# Patient Record
Sex: Female | Born: 1955 | Race: Black or African American | Hispanic: No | Marital: Married | Smoking: Never smoker
Health system: Southern US, Community
[De-identification: ages and names within clinical notes are randomized; demographics above are authoritative.]

## PROBLEM LIST (undated history)

## (undated) DIAGNOSIS — I10 Essential (primary) hypertension: Secondary | ICD-10-CM

## (undated) DIAGNOSIS — K219 Gastro-esophageal reflux disease without esophagitis: Secondary | ICD-10-CM

## (undated) DIAGNOSIS — E785 Hyperlipidemia, unspecified: Secondary | ICD-10-CM

## (undated) DIAGNOSIS — E876 Hypokalemia: Secondary | ICD-10-CM

## (undated) HISTORY — DX: Hyperlipidemia, unspecified: E78.5

## (undated) HISTORY — DX: Gastro-esophageal reflux disease without esophagitis: K21.9

## (undated) HISTORY — DX: Essential (primary) hypertension: I10

## (undated) HISTORY — PX: VAGINAL HYSTERECTOMY: SUR661

## (undated) HISTORY — DX: Hypokalemia: E87.6

---

## 2009-11-06 ENCOUNTER — Ambulatory Visit: Payer: Self-pay | Admitting: Family Medicine

## 2010-11-10 ENCOUNTER — Ambulatory Visit: Payer: Self-pay | Admitting: Family Medicine

## 2011-06-18 ENCOUNTER — Ambulatory Visit: Payer: Self-pay | Admitting: Family Medicine

## 2011-11-16 ENCOUNTER — Ambulatory Visit: Payer: Self-pay | Admitting: Family Medicine

## 2012-11-16 ENCOUNTER — Ambulatory Visit: Payer: Self-pay | Admitting: Family Medicine

## 2012-11-21 ENCOUNTER — Ambulatory Visit: Payer: Self-pay | Admitting: Family Medicine

## 2013-04-25 ENCOUNTER — Ambulatory Visit: Payer: Self-pay | Admitting: Family Medicine

## 2013-09-18 ENCOUNTER — Ambulatory Visit: Payer: Self-pay | Admitting: Family Medicine

## 2014-01-21 ENCOUNTER — Ambulatory Visit: Payer: Self-pay | Admitting: Family Medicine

## 2014-12-17 ENCOUNTER — Ambulatory Visit (INDEPENDENT_AMBULATORY_CARE_PROVIDER_SITE_OTHER): Payer: BC Managed Care – PPO | Admitting: Family Medicine

## 2014-12-17 ENCOUNTER — Encounter: Payer: Self-pay | Admitting: Family Medicine

## 2014-12-17 ENCOUNTER — Other Ambulatory Visit: Payer: Self-pay

## 2014-12-17 ENCOUNTER — Other Ambulatory Visit: Payer: Self-pay | Admitting: Family Medicine

## 2014-12-17 VITALS — BP 130/80 | HR 78 | Ht 61.0 in | Wt 212.0 lb

## 2014-12-17 DIAGNOSIS — I10 Essential (primary) hypertension: Secondary | ICD-10-CM

## 2014-12-17 DIAGNOSIS — R635 Abnormal weight gain: Secondary | ICD-10-CM | POA: Insufficient documentation

## 2014-12-17 DIAGNOSIS — M2550 Pain in unspecified joint: Secondary | ICD-10-CM

## 2014-12-17 DIAGNOSIS — E876 Hypokalemia: Secondary | ICD-10-CM | POA: Insufficient documentation

## 2014-12-17 DIAGNOSIS — R609 Edema, unspecified: Secondary | ICD-10-CM

## 2014-12-17 DIAGNOSIS — K2100 Gastro-esophageal reflux disease with esophagitis, without bleeding: Secondary | ICD-10-CM | POA: Insufficient documentation

## 2014-12-17 DIAGNOSIS — Z0000 Encounter for general adult medical examination without abnormal findings: Secondary | ICD-10-CM | POA: Insufficient documentation

## 2014-12-17 DIAGNOSIS — J309 Allergic rhinitis, unspecified: Secondary | ICD-10-CM | POA: Insufficient documentation

## 2014-12-17 MED ORDER — IBUPROFEN 600 MG PO TABS: 600.0000 mg | ORAL_TABLET | Freq: Three times a day (TID) | ORAL | Status: DC | PRN

## 2014-12-17 NOTE — Progress Notes (Signed)
Name: Toni Arroyo   MRN: [id number]    DOB: Jun 03, 1956   Date:12/17/2014       Progress Note  Subjective  Chief Complaint  Chief Complaint  Patient presents with  . Edema    ankle- especially on the L) side, "down some in the morning"    Hypertension This is a recurrent problem. The current episode started 1 to 4 weeks ago. The problem has been waxing and waning since onset. The problem is uncontrolled. Associated symptoms include peripheral edema. Pertinent negatives include no anxiety, blurred vision, chest pain, headaches, malaise/fatigue, neck pain, orthopnea, palpitations, PND, shortness of breath or sweats. There are no associated agents to hypertension. There are no known risk factors for coronary artery disease. Past treatments include beta blockers and diuretics. The current treatment provides mild improvement. There are no compliance problems.  There is no history of angina, kidney disease, CAD/MI, CVA, heart failure, left ventricular hypertrophy, PVD, renovascular disease, retinopathy or a thyroid problem. There is no history of chronic renal disease, coarctation of the aorta, hyperaldosteronism, hypercortisolism, hyperparathyroidism, a hypertension causing med, pheochromocytoma or sleep apnea.    No problem-specific assessment & plan notes found for this encounter.   Past Medical History  Diagnosis Date  . Hypertension   . Hyperlipidemia   . GERD (gastroesophageal reflux disease)   . Hypokalemia     Past Surgical History  Procedure Laterality Date  . Vaginal hysterectomy    . Cesarean section      x 1    Family History  Problem Relation Age of Onset  . Diabetes Sister     History   Social History  . Marital Status: Married    Spouse Name: N/A  . Number of Children: N/A  . Years of Education: N/A   Occupational History  . Not on file.   Social History Main Topics  . Smoking status: Never Smoker   . Smokeless tobacco: Not on file  . Alcohol Use: No  .  Drug Use: No  . Sexual Activity: Yes   Other Topics Concern  . Not on file   Social History Narrative  . No narrative on file    No Known Allergies   Review of Systems  Constitutional: Negative for fever, chills and malaise/fatigue.  Eyes: Negative for blurred vision.  Respiratory: Negative for cough, sputum production, shortness of breath and wheezing.   Cardiovascular: Positive for leg swelling. Negative for chest pain, palpitations, orthopnea, claudication and PND.  Gastrointestinal: Negative for heartburn, nausea, abdominal pain, diarrhea and constipation.  Genitourinary: Negative for dysuria.  Musculoskeletal: Negative for myalgias and neck pain.  Skin: Negative for rash.  Neurological: Negative for dizziness, focal weakness and headaches.  Endo/Heme/Allergies: Negative for polydipsia.  Psychiatric/Behavioral: Negative for depression and suicidal ideas.     Objective  Filed Vitals:   12/17/14 1403  BP: 130/80  Pulse: 78  Height: 5\' 1"  (1.549 m)  Weight: 212 lb (96.163 kg)    Physical Exam  Constitutional: She is well-developed, well-nourished, and in no distress. No distress.  HENT:  Head: Normocephalic and atraumatic.  Right Ear: External ear normal.  Left Ear: External ear normal.  Nose: Nose normal.  Mouth/Throat: Oropharynx is clear and moist.  Eyes: Conjunctivae and EOM are normal. Pupils are equal, round, and reactive to light. Right eye exhibits no discharge. Left eye exhibits no discharge.  Neck: Normal range of motion. Neck supple. No JVD present. No thyromegaly present.  Cardiovascular: Normal rate, regular rhythm, S1 normal,  S2 normal, normal heart sounds and intact distal pulses.  PMI is displaced.  Exam reveals no gallop and no friction rub.   No murmur heard. Pulmonary/Chest: Effort normal and breath sounds normal. She has no wheezes.  Abdominal: Soft. Bowel sounds are normal. She exhibits no mass. There is no tenderness. There is no guarding.   Musculoskeletal: Normal range of motion. She exhibits edema. She exhibits no tenderness.  Lymphadenopathy:    She has no cervical adenopathy.  Neurological: She is alert. She has normal reflexes.  Skin: Skin is warm and dry. She is not diaphoretic.  Psychiatric: Mood and affect normal.      Assessment & Plan  Problem List Items Addressed This Visit      Cardiovascular and Mediastinum   Essential (primary) hypertension   Relevant Medications   aspirin 81 MG tablet     Other   Dependent edema - Primary   Relevant Orders   Ambulatory referral to Vascular Surgery        Dr. Elizabeth Sauer [facility]  12/17/2014

## 2014-12-19 ENCOUNTER — Other Ambulatory Visit: Payer: Self-pay

## 2015-01-06 ENCOUNTER — Other Ambulatory Visit: Payer: BC Managed Care – PPO

## 2015-01-06 DIAGNOSIS — I10 Essential (primary) hypertension: Secondary | ICD-10-CM

## 2015-01-07 LAB — LIPID PANEL
CHOLESTEROL TOTAL: 180 mg/dL (ref 100–199)
Chol/HDL Ratio: 3 ratio units (ref 0.0–4.4)
HDL: 61 mg/dL (ref >39)
LDL Calculated: 104 mg/dL — ABNORMAL HIGH (ref 0–99)
Triglycerides: 73 mg/dL (ref 0–149)

## 2015-01-07 LAB — RENAL FUNCTION PANEL: Phosphorus: 2.2 mg/dL — ABNORMAL LOW (ref 2.5–4.5)

## 2015-07-04 ENCOUNTER — Ambulatory Visit (INDEPENDENT_AMBULATORY_CARE_PROVIDER_SITE_OTHER): Payer: BC Managed Care – PPO | Admitting: Family Medicine

## 2015-07-04 ENCOUNTER — Encounter: Payer: Self-pay | Admitting: Family Medicine

## 2015-07-04 VITALS — BP 120/80 | HR 80 | Ht 61.0 in | Wt 212.0 lb

## 2015-07-04 DIAGNOSIS — M150 Primary generalized (osteo)arthritis: Secondary | ICD-10-CM | POA: Diagnosis not present

## 2015-07-04 DIAGNOSIS — E876 Hypokalemia: Secondary | ICD-10-CM | POA: Diagnosis not present

## 2015-07-04 DIAGNOSIS — I10 Essential (primary) hypertension: Secondary | ICD-10-CM | POA: Diagnosis not present

## 2015-07-04 DIAGNOSIS — Z1159 Encounter for screening for other viral diseases: Secondary | ICD-10-CM

## 2015-07-04 DIAGNOSIS — Z0000 Encounter for general adult medical examination without abnormal findings: Secondary | ICD-10-CM | POA: Diagnosis not present

## 2015-07-04 DIAGNOSIS — M159 Polyosteoarthritis, unspecified: Secondary | ICD-10-CM

## 2015-07-04 DIAGNOSIS — Z1239 Encounter for other screening for malignant neoplasm of breast: Secondary | ICD-10-CM

## 2015-07-04 DIAGNOSIS — K219 Gastro-esophageal reflux disease without esophagitis: Secondary | ICD-10-CM

## 2015-07-04 DIAGNOSIS — Z23 Encounter for immunization: Secondary | ICD-10-CM | POA: Diagnosis not present

## 2015-07-04 DIAGNOSIS — Z13220 Encounter for screening for lipoid disorders: Secondary | ICD-10-CM

## 2015-07-04 DIAGNOSIS — M2550 Pain in unspecified joint: Secondary | ICD-10-CM | POA: Diagnosis not present

## 2015-07-04 MED ORDER — HYDROCHLOROTHIAZIDE 25 MG PO TABS: 25.0000 mg | ORAL_TABLET | Freq: Every day | ORAL | Status: DC

## 2015-07-04 MED ORDER — PANTOPRAZOLE SODIUM 40 MG PO TBEC: 40.0000 mg | DELAYED_RELEASE_TABLET | Freq: Every day | ORAL | Status: DC

## 2015-07-04 MED ORDER — ATENOLOL 100 MG PO TABS: 100.0000 mg | ORAL_TABLET | Freq: Every day | ORAL | Status: DC

## 2015-07-04 MED ORDER — POTASSIUM CHLORIDE CRYS ER 20 MEQ PO TBCR: 20.0000 meq | EXTENDED_RELEASE_TABLET | Freq: Every day | ORAL | Status: DC

## 2015-07-04 MED ORDER — IBUPROFEN 600 MG PO TABS: 600.0000 mg | ORAL_TABLET | Freq: Three times a day (TID) | ORAL | Status: DC | PRN

## 2015-07-04 NOTE — Progress Notes (Signed)
Name: Toni Arroyo   MRN: [id number]    DOB: 1955-06-28   Date:07/04/2015       Progress Note  Subjective  Chief Complaint  Chief Complaint  Patient presents with  . Annual Exam  . Hypertension  . hypokalemia  . Arthritis    HPI Comments: Patient presents for annual physical exam. Patient would like to defer pap to next year.  Hypertension This is a chronic problem. The current episode started more than 1 year ago. The problem has been gradually improving since onset. The problem is controlled. Pertinent negatives include no anxiety, blurred vision, chest pain, headaches, malaise/fatigue, neck pain, orthopnea, palpitations, peripheral edema, PND, shortness of breath or sweats. There are no associated agents to hypertension. There are no known risk factors for coronary artery disease. Past treatments include beta blockers and diuretics. The current treatment provides moderate improvement. There are no compliance problems.  There is no history of CAD/MI, CVA, heart failure, left ventricular hypertrophy, PVD, renovascular disease or retinopathy. There is no history of chronic renal disease or a hypertension causing med.  Arthritis Presents for follow-up visit. She complains of pain. Affected locations include the right elbow. Her pain is at a severity of 4/10. Pertinent negatives include no diarrhea, dysuria, fatigue, fever, rash or weight loss. Past treatments include NSAIDs. Factors aggravating her arthritis include activity.  Gastroesophageal Reflux She complains of heartburn. She reports no abdominal pain, no belching, no chest pain, no choking, no coughing, no dysphagia, no nausea, no sore throat or no wheezing. This is a recurrent problem. The problem occurs occasionally. The problem has been waxing and waning. The heartburn duration is several minutes. The symptoms are aggravated by certain foods. Pertinent negatives include no anemia, fatigue, melena, muscle weakness, orthopnea or weight  loss. She has tried a PPI for the symptoms.    No problem-specific assessment & plan notes found for this encounter.   Past Medical History  Diagnosis Date  . Hypertension   . Hyperlipidemia   . GERD (gastroesophageal reflux disease)   . Hypokalemia     Past Surgical History  Procedure Laterality Date  . Vaginal hysterectomy    . Cesarean section      x 1    Family History  Problem Relation Age of Onset  . Diabetes Sister     Social History   Social History  . Marital Status: Married    Spouse Name: N/A  . Number of Children: N/A  . Years of Education: N/A   Occupational History  . Not on file.   Social History Main Topics  . Smoking status: Never Smoker   . Smokeless tobacco: Not on file  . Alcohol Use: No  . Drug Use: No  . Sexual Activity: Yes   Other Topics Concern  . Not on file   Social History Narrative    No Known Allergies   Review of Systems  Constitutional: Negative for fever, chills, weight loss, malaise/fatigue and fatigue.  HENT: Negative for ear discharge, ear pain and sore throat.   Eyes: Negative for blurred vision.  Respiratory: Negative for cough, sputum production, choking, shortness of breath and wheezing.   Cardiovascular: Negative for chest pain, palpitations, orthopnea, leg swelling and PND.  Gastrointestinal: Positive for heartburn. Negative for dysphagia, nausea, abdominal pain, diarrhea, constipation, blood in stool and melena.  Genitourinary: Negative for dysuria, urgency, frequency and hematuria.  Musculoskeletal: Positive for arthritis. Negative for myalgias, back pain, joint pain, muscle weakness and neck pain.  Skin:  Negative for rash.  Neurological: Negative for dizziness, tingling, sensory change, focal weakness and headaches.  Endo/Heme/Allergies: Negative for environmental allergies and polydipsia. Does not bruise/bleed easily.  Psychiatric/Behavioral: Negative for depression and suicidal ideas. The patient is not  nervous/anxious and does not have insomnia.      Objective  Filed Vitals:   07/04/15 0825  BP: 120/80  Pulse: 80  Height:  (1.549 m)  Weight: 212 lb (96.163 kg)    Physical Exam  Constitutional: She is well-developed, well-nourished, and in no distress. No distress.  HENT:  Head: Normocephalic and atraumatic.  Right Ear: External ear normal.  Left Ear: External ear normal.  Nose: Nose normal.  Mouth/Throat: Oropharynx is clear and moist.  Eyes: Conjunctivae and EOM are normal. Pupils are equal, round, and reactive to light. Right eye exhibits no discharge. Left eye exhibits no discharge.  Neck: Normal range of motion. Neck supple. No JVD present. No thyromegaly present.  Cardiovascular: Normal rate, regular rhythm, normal heart sounds and intact distal pulses.  Exam reveals no gallop and no friction rub.   No murmur heard. Pulmonary/Chest: Effort normal and breath sounds normal. Right breast exhibits no inverted nipple, no mass, no nipple discharge, no skin change and no tenderness. Left breast exhibits no inverted nipple, no mass, no nipple discharge, no skin change and no tenderness.  Abdominal: Soft. Bowel sounds are normal. She exhibits no mass. There is no tenderness. There is no guarding.  Musculoskeletal: Normal range of motion. She exhibits no edema.  Lymphadenopathy:    She has no cervical adenopathy.  Neurological: She is alert. She has normal reflexes.  Skin: Skin is warm and dry. She is not diaphoretic.  Psychiatric: Mood and affect normal.  Nursing note and vitals reviewed.     Assessment & Plan  Problem List Items Addressed This Visit      Cardiovascular and Mediastinum   Essential (primary) hypertension   Relevant Medications   atenolol (TENORMIN) 100 MG tablet   hydrochlorothiazide (HYDRODIURIL) 25 MG tablet    Other Visit Diagnoses    Annual physical exam    -  Primary    Relevant Orders    Renal Function Panel    Hepatitis C Antibody     Gastroesophageal reflux disease, esophagitis presence not specified        Relevant Medications    pantoprazole (PROTONIX) 40 MG tablet    Hypokalemia        Relevant Medications    potassium chloride SA (KLOR-CON M20) 20 MEQ tablet    Primary osteoarthritis involving multiple joints        Relevant Medications    ibuprofen (ADVIL,MOTRIN) 600 MG tablet    Joint pain        Relevant Medications    ibuprofen (ADVIL,MOTRIN) 600 MG tablet    Lipid screening        Relevant Orders    Lipid Profile    Breast cancer screening        Relevant Orders    MM Digital Screening    Immunization due        Relevant Orders    Tdap vaccine greater than or equal to 7yo IM (Completed)    Need for hepatitis C screening test        Relevant Orders    Hepatitis C Antibody         Dr. [facility]  07/04/2015

## 2015-07-05 LAB — RENAL FUNCTION PANEL
ALBUMIN: 4.3 g/dL (ref 3.5–5.5)
BUN / CREAT RATIO: 17 (ref 9–23)
BUN: 12 mg/dL (ref 6–24)
CALCIUM: 9.8 mg/dL (ref 8.7–10.2)
CO2: 30 mmol/L — ABNORMAL HIGH (ref 18–29)
CREATININE: 0.71 mg/dL (ref 0.57–1.00)
Chloride: 99 mmol/L (ref 96–106)
GFR calc non Af Amer: 94 mL/min/{1.73_m2} (ref >59)
GFR, EST AFRICAN AMERICAN: 109 mL/min/{1.73_m2} (ref >59)
Glucose: 84 mg/dL (ref 65–99)
Phosphorus: 3.5 mg/dL (ref 2.5–4.5)
Potassium: 4 mmol/L (ref 3.5–5.2)
Sodium: 143 mmol/L (ref 134–144)

## 2015-07-05 LAB — LIPID PANEL
CHOLESTEROL TOTAL: 194 mg/dL (ref 100–199)
Chol/HDL Ratio: 3 ratio units (ref 0.0–4.4)
HDL: 65 mg/dL (ref >39)
LDL CALC: 116 mg/dL — AB (ref 0–99)
TRIGLYCERIDES: 67 mg/dL (ref 0–149)
VLDL CHOLESTEROL CAL: 13 mg/dL (ref 5–40)

## 2015-07-05 LAB — HEPATITIS C ANTIBODY

## 2015-07-10 ENCOUNTER — Ambulatory Visit
Admission: RE | Admit: 2015-07-10 | Discharge: 2015-07-10 | Disposition: A | Discharge status: Home / Self Care | Payer: BC Managed Care – PPO | Source: Ambulatory Visit | Attending: Family Medicine | Admitting: Family Medicine

## 2015-07-10 DIAGNOSIS — Z1239 Encounter for other screening for malignant neoplasm of breast: Secondary | ICD-10-CM

## 2015-07-10 DIAGNOSIS — Z1231 Encounter for screening mammogram for malignant neoplasm of breast: Secondary | ICD-10-CM | POA: Diagnosis not present

## 2015-08-04 ENCOUNTER — Other Ambulatory Visit: Payer: Self-pay

## 2015-08-04 MED ORDER — AZITHROMYCIN 250 MG PO TABS: ORAL_TABLET | ORAL | Status: DC

## 2015-08-09 IMAGING — US ABDOMEN ULTRASOUND LIMITED
1 series · 14 of 25 positions shown · non-contrast
Comparison: None.

CLINICAL DATA: Right upper quadrant discomfort.

EXAM:
US ABDOMEN LIMITED - RIGHT UPPER QUADRANT

[Series 1: abdomen ultrasound limited · 0.24mm/px · 14 of 42 slices shown]
[im 1/42]
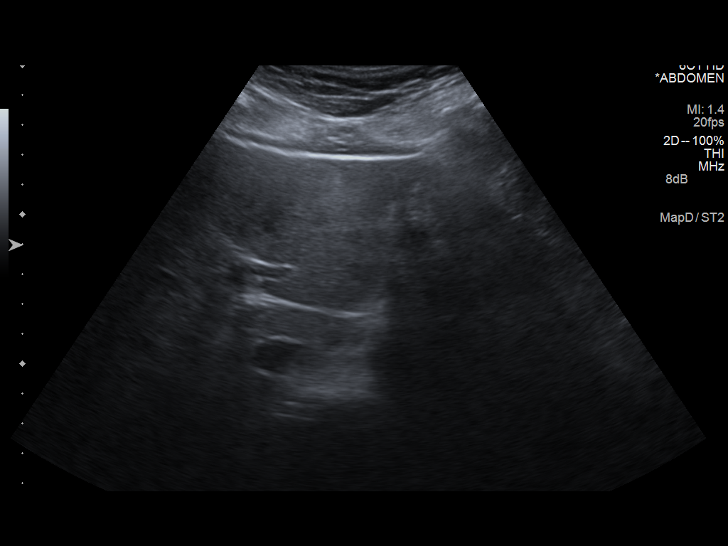
[im 4/42]
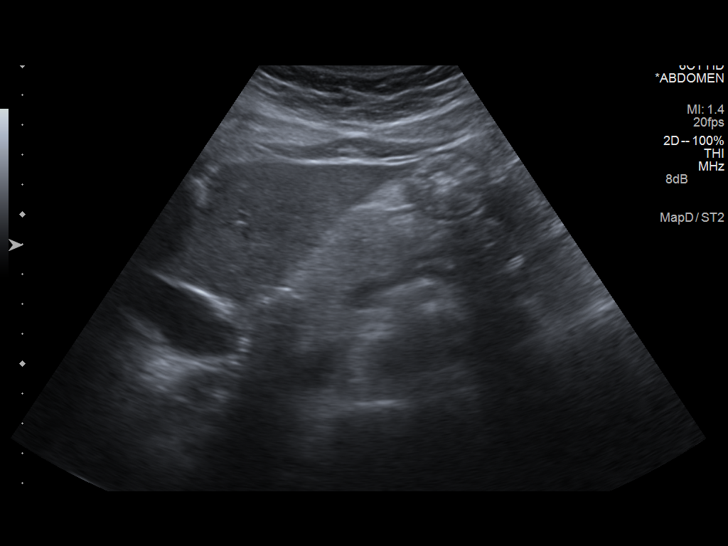
[im 7/42]
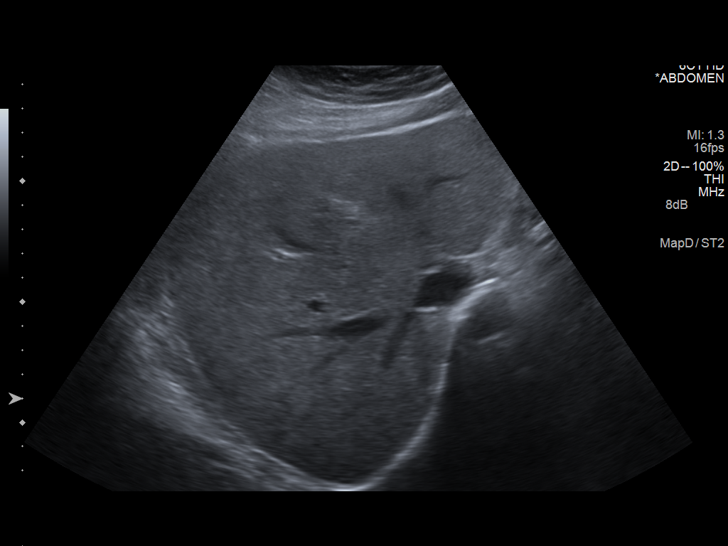
[im 11/42]
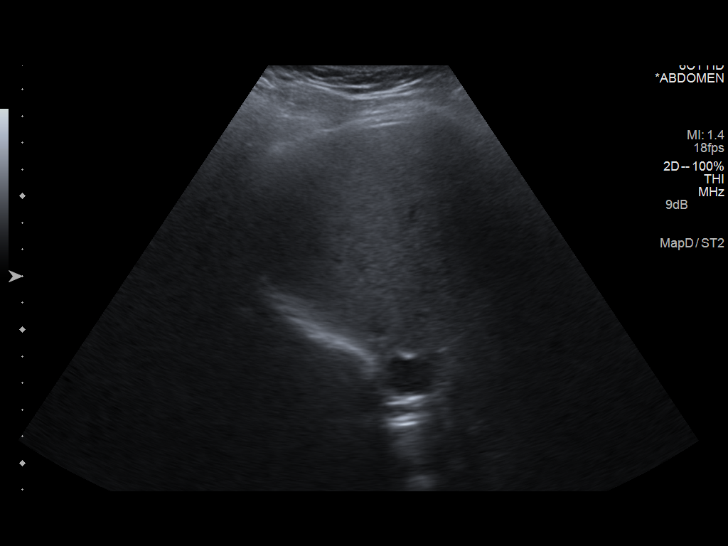
[im 14/42]
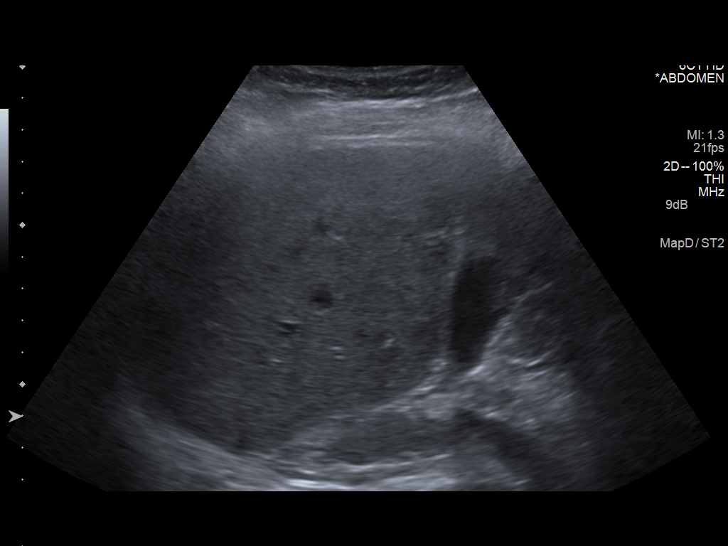
[im 16/42]
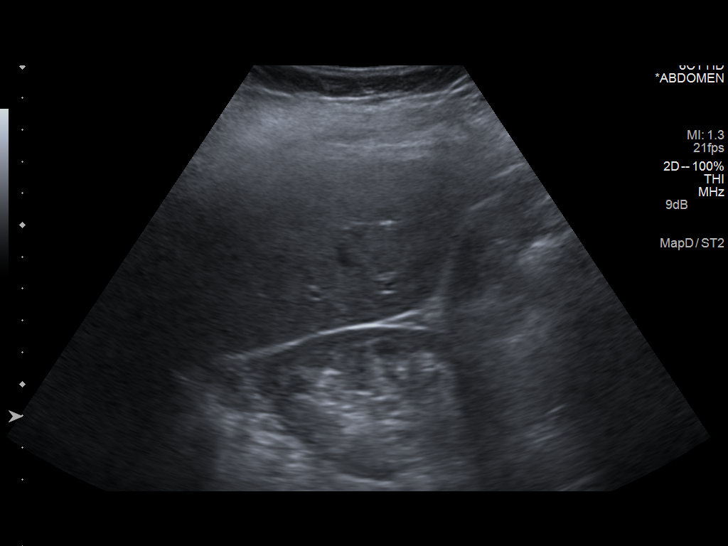
[im 19/42]
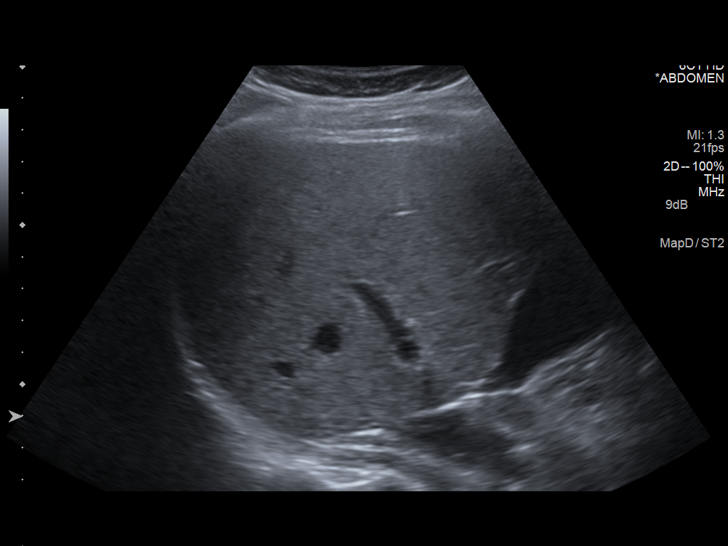
[im 23/42]
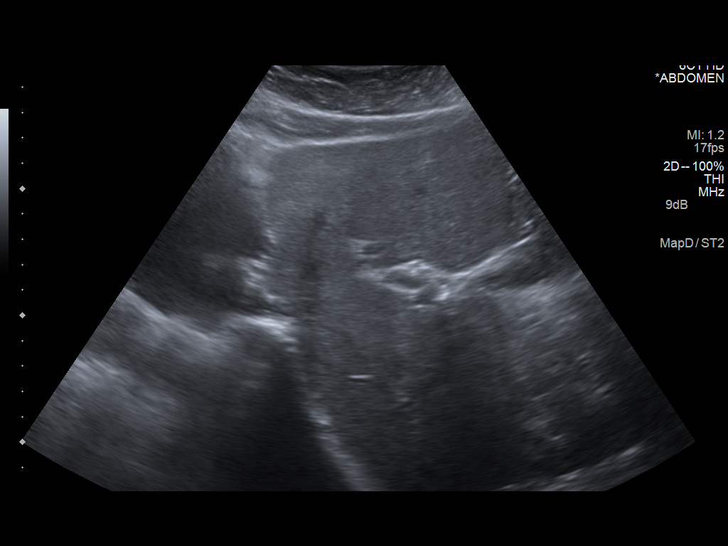
[im 26/42]
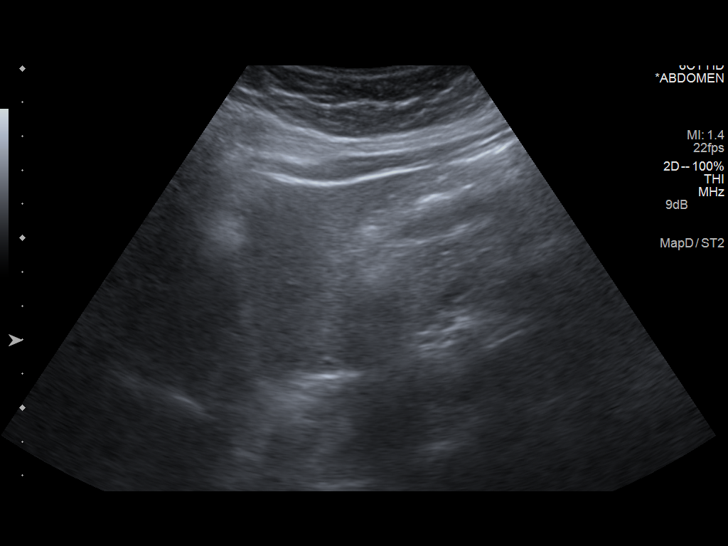
[im 28/42]
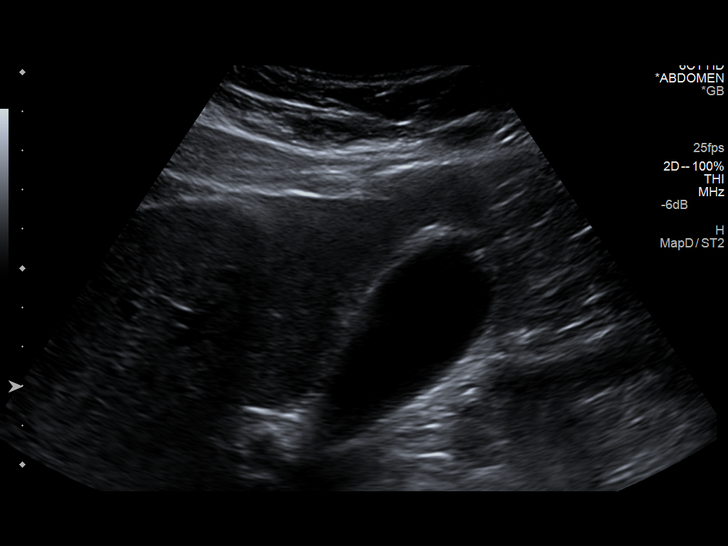
[im 31/42]
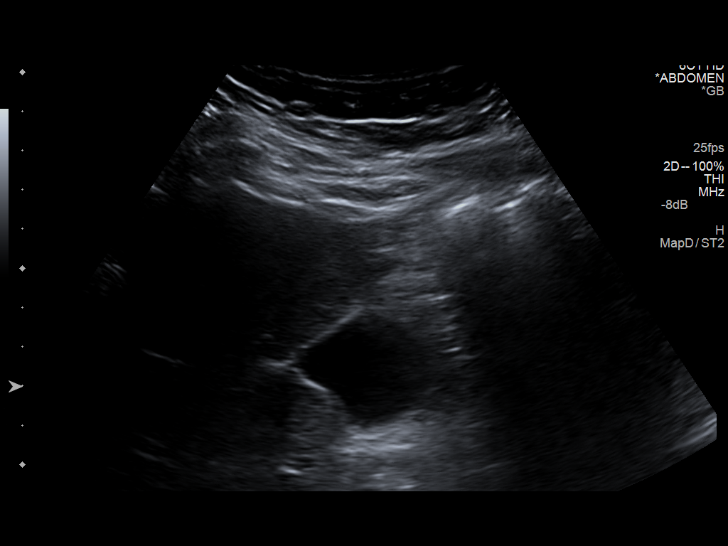
[im 35/42]
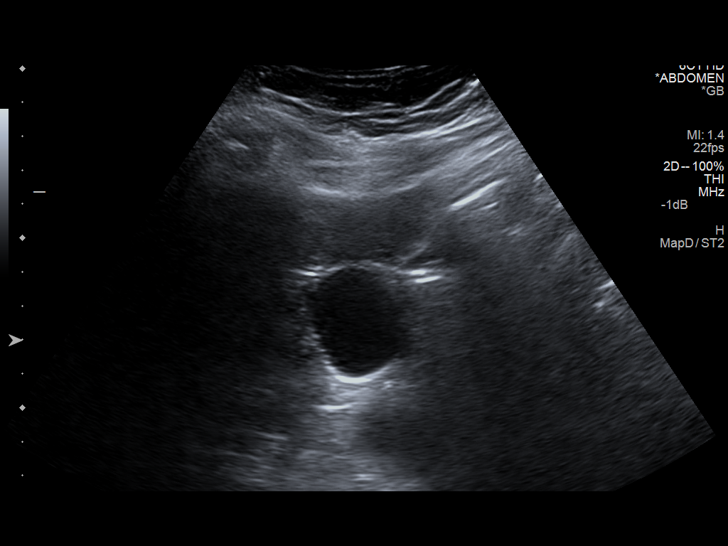
[im 38/42]
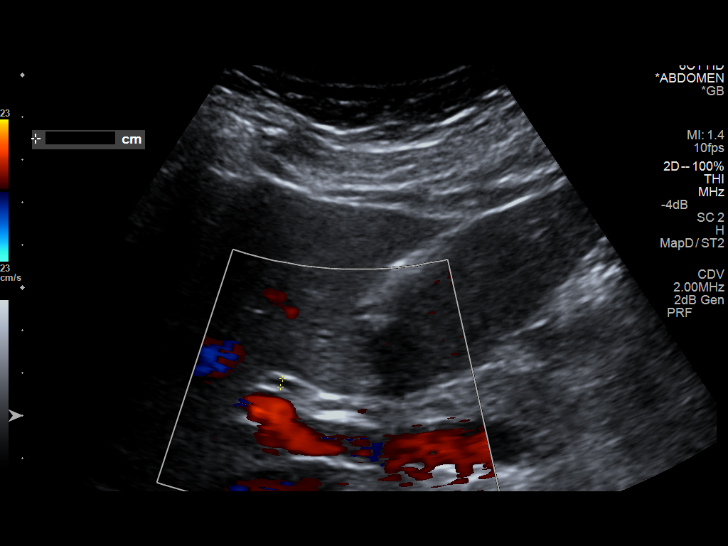
[im 42/42]
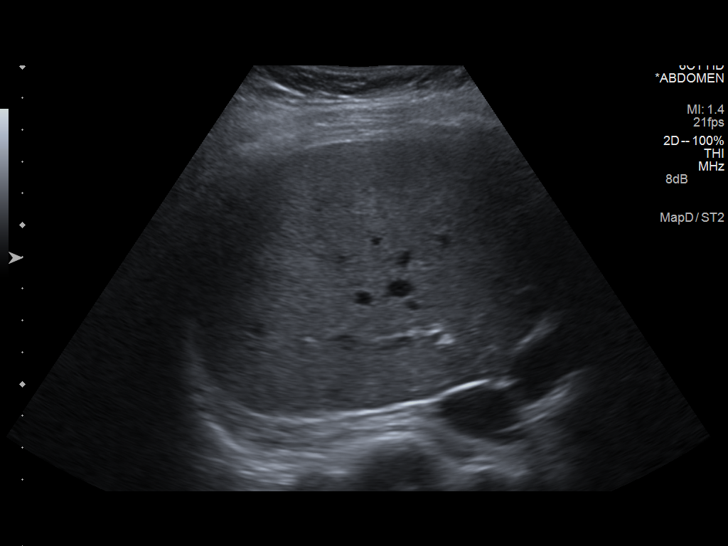

[14 of 25 positions shown; findings below may reference images not displayed]

FINDINGS: Gallbladder

The gallbladder is adequately distended with no evidence of stones,
wall thickening, or pericholecystic fluid. There is no positive
sonographic Murphy sign.

Common bile duct

Diameter: 2.8 mm.

Liver:

The liver measures 16.1 cm in the midclavicular line. There is no
focal mass or ductal dilation. Portal venous flow is normal in
direction toward the liver.
IMPRESSION: Normal limited right upper quadrant ultrasound

## 2015-08-13 ENCOUNTER — Ambulatory Visit (INDEPENDENT_AMBULATORY_CARE_PROVIDER_SITE_OTHER): Payer: BC Managed Care – PPO | Admitting: Family Medicine

## 2015-08-13 ENCOUNTER — Encounter: Payer: Self-pay | Admitting: Family Medicine

## 2015-08-13 VITALS — BP 138/80 | HR 78 | Ht 61.0 in | Wt 211.0 lb

## 2015-08-13 DIAGNOSIS — J301 Allergic rhinitis due to pollen: Secondary | ICD-10-CM

## 2015-08-13 DIAGNOSIS — J0100 Acute maxillary sinusitis, unspecified: Secondary | ICD-10-CM

## 2015-08-13 MED ORDER — AMOXICILLIN 500 MG PO CAPS: 500.0000 mg | ORAL_CAPSULE | Freq: Three times a day (TID) | ORAL | Status: DC

## 2015-08-13 MED ORDER — FLUTICASONE PROPIONATE 50 MCG/ACT NA SUSP: 2.0000 | Freq: Every day | NASAL | Status: DC

## 2015-08-13 NOTE — Progress Notes (Signed)
Name: DAUNA ZISKA   MRN: [id number]    DOB: [birth year]   Date:08/13/2015       Progress Note  Subjective  Chief Complaint  Chief Complaint  Patient presents with  . Sinusitis    finished [employer]Pack x 5 days ago- still is hoarse and feels SOB- chest feels congested    Sinusitis This is a new problem. The current episode started 1 to 4 weeks ago. The problem has been waxing and waning since onset. There has been no fever. Associated symptoms include congestion, ear pain, headaches, a hoarse voice, shortness of breath and sinus pressure. Pertinent negatives include no chills, coughing, diaphoresis, neck pain, sneezing, sore throat or swollen glands. Past treatments include antibiotics. The treatment provided mild relief.    No problem-specific assessment & plan notes found for this encounter.   Past Medical History  Diagnosis Date  . Hypertension   . Hyperlipidemia   . GERD (gastroesophageal reflux disease)   . Hypokalemia     Past Surgical History  Procedure Laterality Date  . Vaginal hysterectomy    . Cesarean section      x 1    Family History  Problem Relation Age of Onset  . Diabetes Sister     Social History   Social History  . Marital Status: Married    Spouse Name: N/A  . Number of Children: N/A  . Years of Education: N/A   Occupational History  . Not on file.   Social History Main Topics  . Smoking status: Never Smoker   . Smokeless tobacco: Not on file  . Alcohol Use: No  . Drug Use: No  . Sexual Activity: Yes   Other Topics Concern  . Not on file   Social History Narrative    No Known Allergies   Review of Systems  Constitutional: Negative for fever, chills, weight loss, malaise/fatigue and diaphoresis.  HENT: Positive for congestion, ear pain, hoarse voice and sinus pressure. Negative for ear discharge, sneezing and sore throat.   Eyes: Negative for blurred vision.  Respiratory: Positive for shortness of breath. Negative for cough, sputum  production and wheezing.   Cardiovascular: Negative for chest pain, palpitations and leg swelling.  Gastrointestinal: Negative for heartburn, nausea, abdominal pain, diarrhea, constipation, blood in stool and melena.  Genitourinary: Negative for dysuria, urgency, frequency and hematuria.  Musculoskeletal: Negative for myalgias, back pain, joint pain and neck pain.  Skin: Negative for rash.  Neurological: Positive for headaches. Negative for dizziness, tingling, sensory change and focal weakness.  Endo/Heme/Allergies: Negative for environmental allergies and polydipsia. Does not bruise/bleed easily.  Psychiatric/Behavioral: Negative for depression and suicidal ideas. The patient is not nervous/anxious and does not have insomnia.      Objective  Filed Vitals:   08/13/15 0919  BP: 138/80  Pulse: 78  Height:  (1.549 m)  Weight: 211 lb (95.709 kg)  SpO2: 99%    Physical Exam  Constitutional: She is well-developed, well-nourished, and in no distress. No distress.  HENT:  Head: Normocephalic and atraumatic.  Right Ear: Tympanic membrane, external ear and ear canal normal.  Left Ear: Tympanic membrane, external ear and ear canal normal.  Nose: Nose normal.  Mouth/Throat: Oropharynx is clear and moist.  Eyes: Conjunctivae and EOM are normal. Pupils are equal, round, and reactive to light. Right eye exhibits no discharge. Left eye exhibits no discharge.  Neck: Normal range of motion. Neck supple. No JVD present. No thyromegaly present.  Cardiovascular: Normal rate, regular rhythm, normal  heart sounds and intact distal pulses.  Exam reveals no gallop and no friction rub.   No murmur heard. Pulmonary/Chest: Effort normal and breath sounds normal.  Abdominal: Soft. Bowel sounds are normal. She exhibits no mass. There is no tenderness. There is no guarding.  Musculoskeletal: Normal range of motion. She exhibits no edema.  Lymphadenopathy:    She has no cervical adenopathy.   Neurological: She is alert. She has normal reflexes.  Skin: Skin is warm and dry. She is not diaphoretic.  Psychiatric: Mood and affect normal.  Nursing note and vitals reviewed.     Assessment & Plan  Problem List Items Addressed This Visit      Respiratory   Allergic rhinitis - Primary   Relevant Medications   amoxicillin (AMOXIL) 500 MG capsule   fluticasone (FLONASE) 50 MCG/ACT nasal spray    Other Visit Diagnoses    Acute maxillary sinusitis, recurrence not specified        Relevant Medications    amoxicillin (AMOXIL) 500 MG capsule    fluticasone (FLONASE) 50 MCG/ACT nasal spray         Dr. [facility]  08/13/2015

## 2016-03-23 ENCOUNTER — Encounter: Payer: Self-pay | Admitting: Family Medicine

## 2016-03-23 ENCOUNTER — Ambulatory Visit (INDEPENDENT_AMBULATORY_CARE_PROVIDER_SITE_OTHER): Payer: BC Managed Care – PPO | Admitting: Family Medicine

## 2016-03-23 VITALS — BP 132/70 | HR 68 | Ht 62.0 in | Wt 215.0 lb

## 2016-03-23 DIAGNOSIS — M5136 Other intervertebral disc degeneration, lumbar region: Secondary | ICD-10-CM | POA: Diagnosis not present

## 2016-03-23 DIAGNOSIS — M150 Primary generalized (osteo)arthritis: Secondary | ICD-10-CM | POA: Diagnosis not present

## 2016-03-23 DIAGNOSIS — K219 Gastro-esophageal reflux disease without esophagitis: Secondary | ICD-10-CM

## 2016-03-23 DIAGNOSIS — E876 Hypokalemia: Secondary | ICD-10-CM | POA: Diagnosis not present

## 2016-03-23 DIAGNOSIS — K2100 Gastro-esophageal reflux disease with esophagitis, without bleeding: Secondary | ICD-10-CM

## 2016-03-23 DIAGNOSIS — J301 Allergic rhinitis due to pollen: Secondary | ICD-10-CM | POA: Diagnosis not present

## 2016-03-23 DIAGNOSIS — I10 Essential (primary) hypertension: Secondary | ICD-10-CM | POA: Diagnosis not present

## 2016-03-23 DIAGNOSIS — M159 Polyosteoarthritis, unspecified: Secondary | ICD-10-CM

## 2016-03-23 MED ORDER — PANTOPRAZOLE SODIUM 40 MG PO TBEC: 40.0000 mg | DELAYED_RELEASE_TABLET | Freq: Every day | ORAL | 1 refills | Status: DC

## 2016-03-23 MED ORDER — POTASSIUM CHLORIDE CRYS ER 20 MEQ PO TBCR: 20.0000 meq | EXTENDED_RELEASE_TABLET | Freq: Every day | ORAL | 1 refills | Status: DC

## 2016-03-23 MED ORDER — HYDROCHLOROTHIAZIDE 25 MG PO TABS: 25.0000 mg | ORAL_TABLET | Freq: Every day | ORAL | 1 refills | Status: DC

## 2016-03-23 MED ORDER — ATENOLOL 100 MG PO TABS: 100.0000 mg | ORAL_TABLET | Freq: Every day | ORAL | 1 refills | Status: DC

## 2016-03-23 MED ORDER — IBUPROFEN 600 MG PO TABS: 600.0000 mg | ORAL_TABLET | Freq: Three times a day (TID) | ORAL | 1 refills | Status: DC | PRN

## 2016-03-23 NOTE — Progress Notes (Signed)
Name: Toni Arroyo   MRN: [id number]    DOB: [birth year]   Date:03/23/2016       Progress Note  Subjective  Chief Complaint  Chief Complaint  Patient presents with  . Hypertension  . Allergic Rhinitis   . hypokalemia    Hypertension  This is a chronic problem. The current episode started more than 1 year ago. The problem has been gradually improving since onset. The problem is controlled. Associated symptoms include headaches. Pertinent negatives include no anxiety, blurred vision, chest pain, malaise/fatigue, neck pain, orthopnea, palpitations, peripheral edema, PND, shortness of breath or sweats. (? tenormen) There are no associated agents to hypertension. Risk factors for coronary artery disease include obesity, dyslipidemia and post-menopausal state. Past treatments include diuretics and beta blockers. The current treatment provides mild improvement. There are no compliance problems.  There is no history of angina, kidney disease, CAD/MI, CVA, heart failure, left ventricular hypertrophy, PVD, renovascular disease or retinopathy. There is no history of chronic renal disease or a hypertension causing med.  Gastroesophageal Reflux  She reports no abdominal pain, no belching, no chest pain, no choking, no coughing, no dysphagia, no heartburn, no hoarse voice, no nausea, no sore throat or no wheezing. This is a recurrent problem. The current episode started more than 1 year ago. The problem has been gradually worsening. The symptoms are aggravated by certain foods. Pertinent negatives include no anemia, fatigue, melena, muscle weakness, orthopnea or weight loss. She has tried a PPI for the symptoms. The treatment provided mild relief.  Back Pain  This is a recurrent problem. The current episode started more than 1 year ago. The problem occurs intermittently. The problem has been waxing and waning since onset. The pain is present in the lumbar spine. The quality of the pain is described as aching.  The pain is moderate. Associated symptoms include headaches. Pertinent negatives include no abdominal pain, bladder incontinence, bowel incontinence, chest pain, dysuria, fever, leg pain, numbness, paresis, paresthesias, perianal numbness, tingling or weight loss.    No problem-specific Assessment & Plan notes found for this encounter.   Past Medical History:  Diagnosis Date  . GERD (gastroesophageal reflux disease)   . Hyperlipidemia   . Hypertension   . Hypokalemia     Past Surgical History:  Procedure Laterality Date  . CESAREAN SECTION     x 1  . VAGINAL HYSTERECTOMY      Family History  Problem Relation Age of Onset  . Diabetes Sister     Social History   Social History  . Marital status: Married    Spouse name: N/A  . Number of children: N/A  . Years of education: N/A   Occupational History  . Not on file.   Social History Main Topics  . Smoking status: Never Smoker  . Smokeless tobacco: Not on file  . Alcohol use No  . Drug use: No  . Sexual activity: Yes   Other Topics Concern  . Not on file   Social History Narrative  . No narrative on file    No Known Allergies   Review of Systems  Constitutional: Negative for chills, fatigue, fever, malaise/fatigue and weight loss.  HENT: Negative for ear discharge, ear pain, hoarse voice and sore throat.   Eyes: Negative for blurred vision.  Respiratory: Negative for cough, sputum production, choking, shortness of breath and wheezing.   Cardiovascular: Negative for chest pain, palpitations, orthopnea, leg swelling and PND.  Gastrointestinal: Negative for abdominal pain, blood in stool,  bowel incontinence, constipation, diarrhea, dysphagia, heartburn, melena and nausea.  Genitourinary: Negative for bladder incontinence, dysuria, frequency, hematuria and urgency.  Musculoskeletal: Positive for back pain. Negative for joint pain, myalgias, muscle weakness and neck pain.  Skin: Negative for rash.  Neurological:  Positive for headaches. Negative for dizziness, tingling, sensory change, focal weakness, numbness and paresthesias.  Endo/Heme/Allergies: Negative for environmental allergies and polydipsia. Does not bruise/bleed easily.  Psychiatric/Behavioral: Negative for depression and suicidal ideas. The patient is not nervous/anxious and does not have insomnia.      Objective  Vitals:   03/23/16 1349  BP: 132/70  Pulse: 68  Weight: 215 lb (97.5 kg)  Height: 5\' 2"  (1.575 m)    Physical Exam  Constitutional: She is well-developed, well-nourished, and in no distress. No distress.  HENT:  Head: Normocephalic and atraumatic.  Right Ear: External ear normal.  Left Ear: External ear normal.  Nose: Nose normal.  Mouth/Throat: Oropharynx is clear and moist.  Eyes: Conjunctivae and EOM are normal. Pupils are equal, round, and reactive to light. Right eye exhibits no discharge. Left eye exhibits no discharge.  Neck: Normal range of motion. Neck supple. No JVD present. No thyromegaly present.  Cardiovascular: Normal rate, regular rhythm, normal heart sounds and intact distal pulses.  Exam reveals no gallop and no friction rub.   No murmur heard. Pulmonary/Chest: Effort normal and breath sounds normal.  Abdominal: Soft. Bowel sounds are normal. She exhibits no mass. There is no tenderness. There is no guarding.  Musculoskeletal: Normal range of motion. She exhibits no edema.  Lymphadenopathy:    She has no cervical adenopathy.  Neurological: She is alert. She has normal reflexes.  Skin: Skin is warm and dry. She is not diaphoretic.  Psychiatric: Mood and affect normal.  Nursing note and vitals reviewed.     Assessment & Plan  Problem List Items Addressed This Visit      Cardiovascular and Mediastinum   Essential (primary) hypertension - Primary   Relevant Medications   atenolol (TENORMIN) 100 MG tablet   hydrochlorothiazide (HYDRODIURIL) 25 MG tablet   Other Relevant Orders   Renal  Function Panel     Respiratory   Allergic rhinitis     Digestive   Esophagitis, reflux    Other Visit Diagnoses    Hypokalemia       Relevant Medications   potassium chloride SA (KLOR-CON M20) 20 MEQ tablet   Gastroesophageal reflux disease, esophagitis presence not specified       Relevant Medications   pantoprazole (PROTONIX) 40 MG tablet   Primary osteoarthritis involving multiple joints       Relevant Medications   ibuprofen (ADVIL,MOTRIN) 600 MG tablet   Degenerative lumbar disc       Relevant Medications   ibuprofen (ADVIL,MOTRIN) 600 MG tablet     I spent 25 minutes with this patient, More than 50% of that time was spent in face to face education, counseling and care coordination.   Dr. Hayden Rasmusseneanna [facility]on [facility]  03/23/16

## 2016-03-24 LAB — RENAL FUNCTION PANEL
Albumin: 4.2 g/dL (ref 3.5–5.5)
BUN/Creatinine Ratio: 19 (ref 9–23)
BUN: 14 mg/dL (ref 6–24)
CHLORIDE: 101 mmol/L (ref 96–106)
CO2: 28 mmol/L (ref 18–29)
Calcium: 9.5 mg/dL (ref 8.7–10.2)
Creatinine, Ser: 0.73 mg/dL (ref 0.57–1.00)
GFR calc non Af Amer: 90 mL/min/{1.73_m2} (ref >59)
GFR, EST AFRICAN AMERICAN: 104 mL/min/{1.73_m2} (ref >59)
Glucose: 87 mg/dL (ref 65–99)
Phosphorus: 3.5 mg/dL (ref 2.5–4.5)
Potassium: 3.8 mmol/L (ref 3.5–5.2)
Sodium: 144 mmol/L (ref 134–144)

## 2016-04-27 ENCOUNTER — Other Ambulatory Visit: Payer: Self-pay

## 2016-04-27 MED ORDER — SULFAMETHOXAZOLE-TRIMETHOPRIM 800-160 MG PO TABS: 1.0000 | ORAL_TABLET | Freq: Two times a day (BID) | ORAL | 0 refills | Status: DC

## 2016-07-08 ENCOUNTER — Other Ambulatory Visit: Payer: Self-pay

## 2016-10-19 ENCOUNTER — Ambulatory Visit (INDEPENDENT_AMBULATORY_CARE_PROVIDER_SITE_OTHER): Payer: BC Managed Care – PPO | Admitting: Family Medicine

## 2016-10-19 VITALS — BP 120/62 | HR 60 | Ht 62.0 in | Wt 217.0 lb

## 2016-10-19 DIAGNOSIS — M150 Primary generalized (osteo)arthritis: Secondary | ICD-10-CM | POA: Diagnosis not present

## 2016-10-19 DIAGNOSIS — M159 Polyosteoarthritis, unspecified: Secondary | ICD-10-CM

## 2016-10-19 DIAGNOSIS — Z1272 Encounter for screening for malignant neoplasm of vagina: Secondary | ICD-10-CM | POA: Diagnosis not present

## 2016-10-19 DIAGNOSIS — E782 Mixed hyperlipidemia: Secondary | ICD-10-CM

## 2016-10-19 DIAGNOSIS — E876 Hypokalemia: Secondary | ICD-10-CM

## 2016-10-19 DIAGNOSIS — Z1239 Encounter for other screening for malignant neoplasm of breast: Secondary | ICD-10-CM

## 2016-10-19 DIAGNOSIS — Z1231 Encounter for screening mammogram for malignant neoplasm of breast: Secondary | ICD-10-CM

## 2016-10-19 DIAGNOSIS — Z0000 Encounter for general adult medical examination without abnormal findings: Secondary | ICD-10-CM

## 2016-10-19 DIAGNOSIS — K219 Gastro-esophageal reflux disease without esophagitis: Secondary | ICD-10-CM | POA: Diagnosis not present

## 2016-10-19 DIAGNOSIS — I10 Essential (primary) hypertension: Secondary | ICD-10-CM

## 2016-10-19 DIAGNOSIS — M5136 Other intervertebral disc degeneration, lumbar region: Secondary | ICD-10-CM

## 2016-10-19 MED ORDER — POTASSIUM CHLORIDE CRYS ER 20 MEQ PO TBCR: 20.0000 meq | EXTENDED_RELEASE_TABLET | Freq: Every day | ORAL | 1 refills | Status: DC

## 2016-10-19 MED ORDER — HYDROCHLOROTHIAZIDE 25 MG PO TABS: 25.0000 mg | ORAL_TABLET | Freq: Every day | ORAL | 1 refills | Status: DC

## 2016-10-19 MED ORDER — IBUPROFEN 600 MG PO TABS: 600.0000 mg | ORAL_TABLET | Freq: Three times a day (TID) | ORAL | 1 refills | Status: DC | PRN

## 2016-10-19 MED ORDER — ATENOLOL 100 MG PO TABS: 100.0000 mg | ORAL_TABLET | Freq: Every day | ORAL | 1 refills | Status: DC

## 2016-10-19 MED ORDER — PANTOPRAZOLE SODIUM 40 MG PO TBEC: 40.0000 mg | DELAYED_RELEASE_TABLET | Freq: Every day | ORAL | 1 refills | Status: DC

## 2016-10-19 NOTE — Progress Notes (Signed)
Name: Toni Arroyo   MRN: [id number]    DOB: 1955/11/29   Date:10/19/2016       Progress Note  Subjective  Chief Complaint  Chief Complaint  Patient presents with  . Annual Exam    needs mammo  . Hypertension  . Gastroesophageal Reflux  . hypokalemia    Patient presents for annual physical exam.    Hypertension  This is a chronic problem. The current episode started more than 1 year ago. The problem has been waxing and waning since onset. The problem is controlled. Pertinent negatives include no anxiety, blurred vision, chest pain, headaches, malaise/fatigue, neck pain, orthopnea, palpitations, peripheral edema, PND, shortness of breath or sweats. There are no associated agents to hypertension. There are no known risk factors for coronary artery disease. Past treatments include diuretics and beta blockers. The current treatment provides moderate improvement. There are no compliance problems.  There is no history of angina, kidney disease, CAD/MI, CVA, heart failure, left ventricular hypertrophy, PVD or retinopathy. There is no history of chronic renal disease, a hypertension causing med or renovascular disease.  Gastroesophageal Reflux  She reports no abdominal pain, no belching, no chest pain, no choking, no coughing, no dysphagia, no early satiety, no globus sensation, no heartburn, no hoarse voice, no nausea, no sore throat, no stridor, no tooth decay, no water brash or no wheezing. This is a new problem. The current episode started more than 1 year ago. The problem has been unchanged. The symptoms are aggravated by certain foods. Pertinent negatives include no anemia, fatigue, melena, muscle weakness, orthopnea or weight loss. Risk factors include obesity. She has tried a PPI for the symptoms. The treatment provided moderate relief. Past procedures do not include an abdominal ultrasound, an EGD, esophageal manometry, esophageal pH monitoring, H. pylori antibody titer or a UGI.    No  problem-specific Assessment & Plan notes found for this encounter.   Past Medical History:  Diagnosis Date  . GERD (gastroesophageal reflux disease)   . Hyperlipidemia   . Hypertension   . Hypokalemia     Past Surgical History:  Procedure Laterality Date  . CESAREAN SECTION     x 1  . VAGINAL HYSTERECTOMY      Family History  Problem Relation Age of Onset  . Diabetes Sister     Social History   Social History  . Marital status: Married    Spouse name: N/A  . Number of children: N/A  . Years of education: N/A   Occupational History  . Not on file.   Social History Main Topics  . Smoking status: Never Smoker  . Smokeless tobacco: Not on file  . Alcohol use No  . Drug use: No  . Sexual activity: Yes   Other Topics Concern  . Not on file   Social History Narrative  . No narrative on file    No Known Allergies  Outpatient Medications Prior to Visit  Medication Sig Dispense Refill  . aspirin 81 MG tablet Take 81 mg by mouth daily.    Marland Kitchen atenolol (TENORMIN) 100 MG tablet Take 1 tablet (100 mg total) by mouth daily. 90 tablet 1  . hydrochlorothiazide (HYDRODIURIL) 25 MG tablet Take 1 tablet (25 mg total) by mouth daily. 90 tablet 1  . ibuprofen (ADVIL,MOTRIN) 600 MG tablet Take 1 tablet (600 mg total) by mouth every 8 (eight) hours as needed. 90 tablet 1  . pantoprazole (PROTONIX) 40 MG tablet Take 1 tablet (40 mg total) by mouth  daily. 90 tablet 1  . potassium chloride SA (KLOR-CON M20) 20 MEQ tablet Take 1 tablet (20 mEq total) by mouth daily. 90 tablet 1  . fluticasone (FLONASE) 50 MCG/ACT nasal spray Place 2 sprays into both nostrils daily. (Patient not taking: Reported on 10/19/2016) 16 g 6  . amoxicillin (AMOXIL) 500 MG capsule Take 1 capsule (500 mg total) by mouth 3 (three) times daily. (Patient not taking: Reported on 03/23/2016) 30 capsule 0  . sulfamethoxazole-trimethoprim (BACTRIM DS,SEPTRA DS) 800-160 MG tablet Take 1 tablet by mouth 2 (two) times daily.  6 tablet 0   No facility-administered medications prior to visit.     Review of Systems  Constitutional: Negative for chills, fatigue, fever, malaise/fatigue and weight loss.  HENT: Negative for ear discharge, ear pain, hoarse voice and sore throat.   Eyes: Negative for blurred vision.  Respiratory: Negative for cough, sputum production, choking, shortness of breath and wheezing.   Cardiovascular: Negative for chest pain, palpitations, orthopnea, leg swelling and PND.  Gastrointestinal: Negative for abdominal pain, blood in stool, constipation, diarrhea, dysphagia, heartburn, melena and nausea.  Genitourinary: Negative for dysuria, frequency, hematuria and urgency.  Musculoskeletal: Negative for back pain, joint pain, myalgias, muscle weakness and neck pain.  Skin: Negative for rash.  Neurological: Negative for dizziness, tingling, sensory change, focal weakness and headaches.  Endo/Heme/Allergies: Negative for environmental allergies and polydipsia. Does not bruise/bleed easily.  Psychiatric/Behavioral: Negative for depression and suicidal ideas. The patient is not nervous/anxious and does not have insomnia.      Objective  Vitals:   10/19/16 0954  BP: 120/62  Pulse: 60  Weight: 217 lb (98.4 kg)  Height: 5\' 2"  (1.575 m)    Physical Exam  Constitutional: She is oriented to person, place, and time and well-developed, well-nourished, and in no distress. Vital signs are normal. No distress.  HENT:  Head: Normocephalic and atraumatic.  Right Ear: Tympanic membrane and external ear normal.  Left Ear: Tympanic membrane and external ear normal.  Nose: Nose normal.  Mouth/Throat: Uvula is midline and oropharynx is clear and moist. No oropharyngeal exudate, posterior oropharyngeal edema or posterior oropharyngeal erythema.  Eyes: Right eye visual fields normal and left eye visual fields normal. Conjunctivae, EOM and lids are normal. Pupils are equal, round, and reactive to light. Right  eye exhibits no discharge. Left eye exhibits no discharge.  Fundoscopic exam:      The right eye shows no arteriolar narrowing and no AV nicking.       The left eye shows no arteriolar narrowing and no AV nicking.  Neck: Trachea normal and normal range of motion. Neck supple. Normal carotid pulses, no hepatojugular reflux and no JVD present. Carotid bruit is not present. No thyromegaly present.  Cardiovascular: Normal rate, regular rhythm, S1 normal, S2 normal, normal heart sounds, intact distal pulses and normal pulses.  PMI is not displaced.  Exam reveals no gallop, no S3, no friction rub and no decreased pulses.   No murmur heard. Pulmonary/Chest: Effort normal and breath sounds normal. She has no wheezes. She has no rales. Right breast exhibits no inverted nipple, no mass, no nipple discharge, no skin change and no tenderness. Left breast exhibits no inverted nipple, no mass, no nipple discharge, no skin change and no tenderness. Breasts are symmetrical.  Abdominal: Soft. Bowel sounds are normal. She exhibits no mass. There is no hepatosplenomegaly. There is no tenderness. There is no guarding and no CVA tenderness.  Genitourinary: Rectum normal, vagina normal, right adnexa normal,  left adnexa normal and vulva normal. Rectal exam shows guaiac negative stool.  Musculoskeletal: Normal range of motion. She exhibits no edema.       Cervical back: Normal.       Thoracic back: Normal.       Lumbar back: Normal.  Lymphadenopathy:       Head (right side): No submandibular adenopathy present.       Head (left side): No submandibular adenopathy present.    She has no cervical adenopathy.  Neurological: She is alert and oriented to person, place, and time. She has normal motor skills and normal reflexes.  Skin: Skin is warm, dry and intact. She is not diaphoretic. No pallor.  Psychiatric: Mood, memory, affect and judgment normal.  Nursing note and vitals reviewed.     Assessment & Plan  Problem  List Items Addressed This Visit      Cardiovascular and Mediastinum   Essential (primary) hypertension   Relevant Medications   hydrochlorothiazide (HYDRODIURIL) 25 MG tablet   atenolol (TENORMIN) 100 MG tablet   Other Relevant Orders   Renal function panel   Lipid panel    Other Visit Diagnoses    Screening for vaginal cancer    -  Primary   Relevant Orders   Pap IG (Image Guided)   Gastroesophageal reflux disease, esophagitis presence not specified       Relevant Medications   pantoprazole (PROTONIX) 40 MG tablet   Hypokalemia       Relevant Medications   potassium chloride SA (KLOR-CON M20) 20 MEQ tablet   Other Relevant Orders   Renal function panel   Mixed hyperlipidemia       Relevant Medications   hydrochlorothiazide (HYDRODIURIL) 25 MG tablet   atenolol (TENORMIN) 100 MG tablet   Other Relevant Orders   Lipid panel   Breast cancer screening       Relevant Orders   MM Digital Screening   Primary osteoarthritis involving multiple joints       Relevant Medications   ibuprofen (ADVIL,MOTRIN) 600 MG tablet   Degenerative lumbar disc       Relevant Medications   ibuprofen (ADVIL,MOTRIN) 600 MG tablet      Meds ordered this encounter  Medications  . hydrochlorothiazide (HYDRODIURIL) 25 MG tablet    Sig: Take 1 tablet (25 mg total) by mouth daily.    Dispense:  90 tablet    Refill:  1  . atenolol (TENORMIN) 100 MG tablet    Sig: Take 1 tablet (100 mg total) by mouth daily.    Dispense:  90 tablet    Refill:  1  . pantoprazole (PROTONIX) 40 MG tablet    Sig: Take 1 tablet (40 mg total) by mouth daily.    Dispense:  90 tablet    Refill:  1  . potassium chloride SA (KLOR-CON M20) 20 MEQ tablet    Sig: Take 1 tablet (20 mEq total) by mouth daily.    Dispense:  90 tablet    Refill:  1  . ibuprofen (ADVIL,MOTRIN) 600 MG tablet    Sig: Take 1 tablet (600 mg total) by mouth every 8 (eight) hours as needed.    Dispense:  90 tablet    Refill:  1      Dr.  Elizabeth Sauer [facility]  10/19/16

## 2016-10-20 LAB — RENAL FUNCTION PANEL
ALBUMIN: 4 g/dL (ref 3.5–5.5)
BUN/Creatinine Ratio: 13 (ref 9–23)
BUN: 10 mg/dL (ref 6–24)
CALCIUM: 9.3 mg/dL (ref 8.7–10.2)
CO2: 29 mmol/L (ref 18–29)
CREATININE: 0.77 mg/dL (ref 0.57–1.00)
Chloride: 99 mmol/L (ref 96–106)
GFR calc Af Amer: 98 mL/min/{1.73_m2} (ref >59)
GFR calc non Af Amer: 85 mL/min/{1.73_m2} (ref >59)
Glucose: 80 mg/dL (ref 65–99)
PHOSPHORUS: 3.2 mg/dL (ref 2.5–4.5)
Potassium: 4 mmol/L (ref 3.5–5.2)
SODIUM: 143 mmol/L (ref 134–144)

## 2016-10-20 LAB — LIPID PANEL
CHOLESTEROL TOTAL: 181 mg/dL (ref 100–199)
Chol/HDL Ratio: 3 ratio (ref 0.0–4.4)
HDL: 60 mg/dL (ref >39)
LDL CALC: 102 mg/dL — AB (ref 0–99)
Triglycerides: 97 mg/dL (ref 0–149)
VLDL Cholesterol Cal: 19 mg/dL (ref 5–40)

## 2016-10-21 LAB — PAP IG (IMAGE GUIDED): PAP Smear Comment: 0

## 2016-10-26 ENCOUNTER — Ambulatory Visit: Admission: RE | Admit: 2016-10-26 | Payer: BC Managed Care – PPO | Source: Ambulatory Visit

## 2017-02-09 ENCOUNTER — Ambulatory Visit
Admission: RE | Admit: 2017-02-09 | Discharge: 2017-02-09 | Disposition: A | Discharge status: Home / Self Care | Payer: BC Managed Care – PPO | Source: Ambulatory Visit | Attending: Family Medicine | Admitting: Family Medicine

## 2017-02-09 ENCOUNTER — Other Ambulatory Visit: Payer: Self-pay | Admitting: Family Medicine

## 2017-02-09 DIAGNOSIS — Z1239 Encounter for other screening for malignant neoplasm of breast: Secondary | ICD-10-CM

## 2017-02-09 DIAGNOSIS — Z1231 Encounter for screening mammogram for malignant neoplasm of breast: Secondary | ICD-10-CM | POA: Diagnosis not present

## 2017-03-24 ENCOUNTER — Telehealth: Payer: Self-pay

## 2017-03-24 ENCOUNTER — Other Ambulatory Visit: Payer: Self-pay

## 2017-03-24 MED ORDER — MECLIZINE HCL 25 MG PO TABS: 25.0000 mg | ORAL_TABLET | Freq: Three times a day (TID) | ORAL | 0 refills | Status: DC | PRN

## 2017-03-24 NOTE — Telephone Encounter (Signed)
Wanted Meclizine called in for her vertigo. Sent to [facility]

## 2017-05-20 ENCOUNTER — Ambulatory Visit (INDEPENDENT_AMBULATORY_CARE_PROVIDER_SITE_OTHER): Payer: BC Managed Care – PPO | Admitting: Family Medicine

## 2017-05-20 ENCOUNTER — Encounter: Payer: Self-pay | Admitting: Family Medicine

## 2017-05-20 VITALS — BP 126/80 | HR 60 | Ht 62.0 in | Wt 218.0 lb

## 2017-05-20 DIAGNOSIS — J0100 Acute maxillary sinusitis, unspecified: Secondary | ICD-10-CM | POA: Diagnosis not present

## 2017-05-20 MED ORDER — AZITHROMYCIN 250 MG PO TABS: ORAL_TABLET | ORAL | 0 refills | Status: DC

## 2017-05-20 NOTE — Progress Notes (Signed)
Name: Toni Arroyo   MRN: [id number]    DOB: Mar 22, 1956   Date:05/20/2017       Progress Note  Subjective  Chief Complaint  Chief Complaint  Patient presents with  . Sinusitis    pressure behind eyes, sneezing    Sinusitis  This is a new problem. The current episode started yesterday. The problem has been gradually worsening since onset. The maximum temperature recorded prior to her arrival was 100.4 - 100.9 F. The pain is mild. Associated symptoms include chills, congestion, coughing, headaches, a hoarse voice, sinus pressure and sneezing. Pertinent negatives include no ear pain, neck pain, shortness of breath, sore throat or swollen glands. Treatments tried: claritin. The treatment provided mild relief.    No problem-specific Assessment & Plan notes found for this encounter.   Past Medical History:  Diagnosis Date  . GERD (gastroesophageal reflux disease)   . Hyperlipidemia   . Hypertension   . Hypokalemia     Past Surgical History:  Procedure Laterality Date  . CESAREAN SECTION     x 1  . VAGINAL HYSTERECTOMY      Family History  Problem Relation Age of Onset  . Diabetes Sister   . Breast cancer Neg Hx     Social History   Socioeconomic History  . Marital status: Married    Spouse name: Not on file  . Number of children: Not on file  . Years of education: Not on file  . Highest education level: Not on file  Social Needs  . Financial resource strain: Not on file  . Food insecurity - worry: Not on file  . Food insecurity - inability: Not on file  . Transportation needs - medical: Not on file  . Transportation needs - non-medical: Not on file  Occupational History  . Not on file  Tobacco Use  . Smoking status: Never Smoker  . Smokeless tobacco: Never Used  Substance and Sexual Activity  . Alcohol use: No    Alcohol/week: 0.0 oz  . Drug use: No  . Sexual activity: Yes  Other Topics Concern  . Not on file  Social History Narrative  . Not on file     No Known Allergies  Outpatient Medications Prior to Visit  Medication Sig Dispense Refill  . aspirin 81 MG tablet Take 81 mg by mouth daily.    Marland Kitchen. atenolol (TENORMIN) 100 MG tablet Take 1 tablet (100 mg total) by mouth daily. 90 tablet 1  . fluticasone (FLONASE) 50 MCG/ACT nasal spray Place 2 sprays into both nostrils daily. 16 g 6  . hydrochlorothiazide (HYDRODIURIL) 25 MG tablet Take 1 tablet (25 mg total) by mouth daily. 90 tablet 1  . ibuprofen (ADVIL,MOTRIN) 600 MG tablet Take 1 tablet (600 mg total) by mouth every 8 (eight) hours as needed. 90 tablet 1  . meclizine (ANTIVERT) 25 MG tablet Take 1 tablet (25 mg total) by mouth 3 (three) times daily as needed for dizziness. 30 tablet 0  . pantoprazole (PROTONIX) 40 MG tablet Take 1 tablet (40 mg total) by mouth daily. 90 tablet 1  . potassium chloride SA (KLOR-CON M20) 20 MEQ tablet Take 1 tablet (20 mEq total) by mouth daily. 90 tablet 1   No facility-administered medications prior to visit.     Review of Systems  Constitutional: Positive for chills. Negative for fever, malaise/fatigue and weight loss.  HENT: Positive for congestion, hoarse voice, sinus pressure and sneezing. Negative for ear discharge, ear pain and sore throat.  Eyes: Negative for blurred vision.  Respiratory: Positive for cough. Negative for sputum production, shortness of breath and wheezing.   Cardiovascular: Negative for chest pain, palpitations and leg swelling.  Gastrointestinal: Negative for abdominal pain, blood in stool, constipation, diarrhea, heartburn, melena and nausea.  Genitourinary: Negative for dysuria, frequency, hematuria and urgency.  Musculoskeletal: Negative for back pain, joint pain, myalgias and neck pain.  Skin: Negative for rash.  Neurological: Positive for headaches. Negative for dizziness, tingling, sensory change and focal weakness.  Endo/Heme/Allergies: Negative for environmental allergies and polydipsia. Does not bruise/bleed  easily.  Psychiatric/Behavioral: Negative for depression and suicidal ideas. The patient is not nervous/anxious and does not have insomnia.      Objective  Vitals:   05/20/17 1513  BP: 126/80  Pulse: 60  Weight: 218 lb (98.9 kg)  Height: 5\' 2"  (1.575 m)    Physical Exam  Constitutional: She is well-developed, well-nourished, and in no distress. No distress.  HENT:  Head: Normocephalic and atraumatic.  Right Ear: External ear normal.  Left Ear: External ear normal.  Nose: Nose normal. Right sinus exhibits no maxillary sinus tenderness and no frontal sinus tenderness. Left sinus exhibits no maxillary sinus tenderness and no frontal sinus tenderness.  Mouth/Throat: Oropharynx is clear and moist.  Eyes: Conjunctivae and EOM are normal. Pupils are equal, round, and reactive to light. Right eye exhibits no discharge. Left eye exhibits no discharge.  Neck: Normal range of motion. Neck supple. No JVD present. No thyromegaly present.  Cardiovascular: Normal rate, regular rhythm, normal heart sounds and intact distal pulses. Exam reveals no gallop and no friction rub.  No murmur heard. Pulmonary/Chest: Effort normal and breath sounds normal. She has no wheezes. She has no rales.  Abdominal: Soft. Bowel sounds are normal. She exhibits no mass. There is no tenderness. There is no guarding.  Musculoskeletal: Normal range of motion. She exhibits no edema.  Lymphadenopathy:    She has no cervical adenopathy.  Neurological: She is alert. She has normal reflexes.  Skin: Skin is warm and dry. She is not diaphoretic.  Psychiatric: Mood and affect normal.  Nursing note and vitals reviewed.     Assessment & Plan  Problem List Items Addressed This Visit    None    Visit Diagnoses    Acute maxillary sinusitis, recurrence not specified    -  Primary   Relevant Medications   azithromycin (ZITHROMAX) 250 MG tablet      Meds ordered this encounter  Medications  . azithromycin (ZITHROMAX)  250 MG tablet    Sig: 2 today then 1 a day for 4 days    Dispense:  6 tablet    Refill:  0      Dr. Hayden Rasmusseneanna Rainy Rothman [facility]  05/20/17

## 2017-07-04 ENCOUNTER — Encounter: Payer: Self-pay | Admitting: Family Medicine

## 2017-07-04 ENCOUNTER — Ambulatory Visit (INDEPENDENT_AMBULATORY_CARE_PROVIDER_SITE_OTHER): Payer: BC Managed Care – PPO | Admitting: Family Medicine

## 2017-07-04 VITALS — BP 136/78 | HR 60 | Ht 62.0 in | Wt 213.0 lb

## 2017-07-04 DIAGNOSIS — E876 Hypokalemia: Secondary | ICD-10-CM | POA: Diagnosis not present

## 2017-07-04 DIAGNOSIS — Z1211 Encounter for screening for malignant neoplasm of colon: Secondary | ICD-10-CM | POA: Diagnosis not present

## 2017-07-04 DIAGNOSIS — I10 Essential (primary) hypertension: Secondary | ICD-10-CM | POA: Diagnosis not present

## 2017-07-04 DIAGNOSIS — K219 Gastro-esophageal reflux disease without esophagitis: Secondary | ICD-10-CM

## 2017-07-04 LAB — POCT URINALYSIS DIPSTICK
Bilirubin, UA: NEGATIVE
GLUCOSE UA: NEGATIVE
Ketones, UA: NEGATIVE
LEUKOCYTES UA: NEGATIVE
Nitrite, UA: NEGATIVE
PH UA: 5 (ref 5.0–8.0)
Protein, UA: NEGATIVE
RBC UA: NEGATIVE
SPEC GRAV UA: 1.01 (ref 1.010–1.025)
Urobilinogen, UA: 0.2 E.U./dL

## 2017-07-04 MED ORDER — HYDROCHLOROTHIAZIDE 25 MG PO TABS: 25.0000 mg | ORAL_TABLET | Freq: Every day | ORAL | 1 refills | Status: DC

## 2017-07-04 MED ORDER — POTASSIUM CHLORIDE CRYS ER 20 MEQ PO TBCR: 20.0000 meq | EXTENDED_RELEASE_TABLET | Freq: Every day | ORAL | 1 refills | Status: DC

## 2017-07-04 MED ORDER — PANTOPRAZOLE SODIUM 40 MG PO TBEC: 40.0000 mg | DELAYED_RELEASE_TABLET | Freq: Every day | ORAL | 1 refills | Status: DC

## 2017-07-04 MED ORDER — ATENOLOL 100 MG PO TABS: 100.0000 mg | ORAL_TABLET | Freq: Every day | ORAL | 1 refills | Status: DC

## 2017-07-04 NOTE — Progress Notes (Signed)
Name: Toni Arroyo   MRN: [id number]    DOB: 03-03-1956   Date:07/04/2017       Progress Note  Subjective  Chief Complaint  Chief Complaint  Patient presents with  . Hypertension  . Gastroesophageal Reflux  . hypokalemia  . Allergic Rhinitis     Patient presents for medication refill.   Hypertension  This is a chronic problem. The current episode started more than 1 year ago. The problem has been waxing and waning since onset. The problem is controlled. Pertinent negatives include no anxiety, blurred vision, chest pain, headaches, malaise/fatigue, neck pain, orthopnea, palpitations, peripheral edema, PND, shortness of breath or sweats. There are no associated agents to hypertension. Risk factors for coronary artery disease include dyslipidemia. Past treatments include beta blockers and diuretics. The current treatment provides moderate improvement. There are no compliance problems.  There is no history of angina, kidney disease, CAD/MI, CVA, heart failure, left ventricular hypertrophy, PVD or retinopathy. There is no history of chronic renal disease, a hypertension causing med or renovascular disease.  Gastroesophageal Reflux  She complains of heartburn. She reports no abdominal pain, no belching, no chest pain, no choking, no coughing, no early satiety, no hoarse voice, no nausea, no sore throat or no wheezing. This is a chronic problem. The problem occurs rarely. The problem has been unchanged. The heartburn is of mild intensity. The symptoms are aggravated by certain foods. Pertinent negatives include no anemia, fatigue, melena, muscle weakness, orthopnea or weight loss. There are no known risk factors. She has tried a PPI for the symptoms. The treatment provided moderate relief.    No problem-specific Assessment & Plan notes found for this encounter.   Past Medical History:  Diagnosis Date  . GERD (gastroesophageal reflux disease)   . Hyperlipidemia   . Hypertension   .  Hypokalemia     Past Surgical History:  Procedure Laterality Date  . CESAREAN SECTION     x 1  . VAGINAL HYSTERECTOMY      Family History  Problem Relation Age of Onset  . Diabetes Sister   . Breast cancer Neg Hx     Social History   Socioeconomic History  . Marital status: Married    Spouse name: Not on file  . Number of children: Not on file  . Years of education: Not on file  . Highest education level: Not on file  Social Needs  . Financial resource strain: Not on file  . Food insecurity - worry: Not on file  . Food insecurity - inability: Not on file  . Transportation needs - medical: Not on file  . Transportation needs - non-medical: Not on file  Occupational History  . Not on file  Tobacco Use  . Smoking status: Never Smoker  . Smokeless tobacco: Never Used  Substance and Sexual Activity  . Alcohol use: No    Alcohol/week: 0.0 oz  . Drug use: No  . Sexual activity: Yes  Other Topics Concern  . Not on file  Social History Narrative  . Not on file    No Known Allergies  Outpatient Medications Prior to Visit  Medication Sig Dispense Refill  . aspirin 81 MG tablet Take 81 mg by mouth daily.    . fluticasone (FLONASE) 50 MCG/ACT nasal spray Place 2 sprays into both nostrils daily. 16 g 6  . ibuprofen (ADVIL,MOTRIN) 600 MG tablet Take 1 tablet (600 mg total) by mouth every 8 (eight) hours as needed. 90 tablet 1  .  meclizine (ANTIVERT) 25 MG tablet Take 1 tablet (25 mg total) by mouth 3 (three) times daily as needed for dizziness. 30 tablet 0  . atenolol (TENORMIN) 100 MG tablet Take 1 tablet (100 mg total) by mouth daily. 90 tablet 1  . hydrochlorothiazide (HYDRODIURIL) 25 MG tablet Take 1 tablet (25 mg total) by mouth daily. 90 tablet 1  . pantoprazole (PROTONIX) 40 MG tablet Take 1 tablet (40 mg total) by mouth daily. 90 tablet 1  . potassium chloride SA (KLOR-CON M20) 20 MEQ tablet Take 1 tablet (20 mEq total) by mouth daily. 90 tablet 1  . azithromycin  (ZITHROMAX) 250 MG tablet 2 today then 1 a day for 4 days 6 tablet 0   No facility-administered medications prior to visit.     Review of Systems  Constitutional: Negative for chills, fatigue, fever, malaise/fatigue and weight loss.  HENT: Negative for ear discharge, ear pain, hoarse voice and sore throat.   Eyes: Negative for blurred vision.  Respiratory: Negative for cough, sputum production, choking, shortness of breath and wheezing.   Cardiovascular: Negative for chest pain, palpitations, orthopnea, leg swelling and PND.  Gastrointestinal: Positive for heartburn. Negative for abdominal pain, blood in stool, constipation, diarrhea, melena and nausea.  Genitourinary: Negative for dysuria, frequency, hematuria and urgency.  Musculoskeletal: Negative for back pain, joint pain, myalgias, muscle weakness and neck pain.  Skin: Negative for rash.  Neurological: Negative for dizziness, tingling, sensory change, focal weakness and headaches.  Endo/Heme/Allergies: Negative for environmental allergies and polydipsia. Does not bruise/bleed easily.  Psychiatric/Behavioral: Negative for depression and suicidal ideas. The patient is not nervous/anxious and does not have insomnia.      Objective  Vitals:   07/04/17 1352  BP: 136/78  Pulse: 60  Weight: 213 lb (96.6 kg)  Height: 5\' 2"  (1.575 m)    Physical Exam  Constitutional: She is well-developed, well-nourished, and in no distress. No distress.  HENT:  Head: Normocephalic and atraumatic.  Right Ear: External ear normal.  Left Ear: External ear normal.  Nose: Nose normal.  Mouth/Throat: Oropharynx is clear and moist.  Eyes: Conjunctivae and EOM are normal. Pupils are equal, round, and reactive to light. Right eye exhibits no discharge. Left eye exhibits no discharge.  Neck: Normal range of motion. Neck supple. No JVD present. No thyromegaly present.  Cardiovascular: Normal rate, regular rhythm, normal heart sounds and intact distal  pulses. Exam reveals no gallop and no friction rub.  No murmur heard. Pulmonary/Chest: Effort normal and breath sounds normal. She has no wheezes. She has no rales.  Abdominal: Soft. Bowel sounds are normal. She exhibits no mass. There is no tenderness. There is no guarding.  Musculoskeletal: Normal range of motion. She exhibits no edema.  Lymphadenopathy:    She has no cervical adenopathy.  Neurological: She is alert. She has normal reflexes.  Skin: Skin is warm and dry. She is not diaphoretic.  Psychiatric: Mood and affect normal.  Nursing note and vitals reviewed.     Assessment & Plan  Problem List Items Addressed This Visit      Cardiovascular and Mediastinum   Essential (primary) hypertension - Primary   Relevant Medications   atenolol (TENORMIN) 100 MG tablet   hydrochlorothiazide (HYDRODIURIL) 25 MG tablet   Other Relevant Orders   POCT Urinalysis Dipstick (Completed)    Other Visit Diagnoses    Gastroesophageal reflux disease, esophagitis presence not specified       Relevant Medications   pantoprazole (PROTONIX) 40 MG tablet  Hypokalemia       Relevant Medications   potassium chloride SA (KLOR-CON M20) 20 MEQ tablet   Colon cancer screening       Relevant Orders   Ambulatory referral to Gastroenterology      Meds ordered this encounter  Medications  . atenolol (TENORMIN) 100 MG tablet    Sig: Take 1 tablet (100 mg total) by mouth daily.    Dispense:  90 tablet    Refill:  1  . hydrochlorothiazide (HYDRODIURIL) 25 MG tablet    Sig: Take 1 tablet (25 mg total) by mouth daily.    Dispense:  90 tablet    Refill:  1  . pantoprazole (PROTONIX) 40 MG tablet    Sig: Take 1 tablet (40 mg total) by mouth daily.    Dispense:  90 tablet    Refill:  1  . potassium chloride SA (KLOR-CON M20) 20 MEQ tablet    Sig: Take 1 tablet (20 mEq total) by mouth daily.    Dispense:  90 tablet    Refill:  1   Pt is not currently having any issues or concerns for  colonoscopy. She has been triaged on weight loss, bowel changes, blood in stool, etc. She has also been notified to contact her insurance company to see if they will allow for outpatient procedure in [place] [facility]. She will do this while waiting for a call from Dr Annabell Sabal office.   Dr. Hayden [facility]  07/04/17

## 2017-07-29 ENCOUNTER — Other Ambulatory Visit: Payer: Self-pay

## 2017-12-23 ENCOUNTER — Ambulatory Visit (INDEPENDENT_AMBULATORY_CARE_PROVIDER_SITE_OTHER): Payer: BC Managed Care – PPO | Admitting: Family Medicine

## 2017-12-23 ENCOUNTER — Encounter: Payer: Self-pay | Admitting: Family Medicine

## 2017-12-23 VITALS — BP 138/80 | HR 68 | Resp 16 | Ht 62.0 in | Wt 215.0 lb

## 2017-12-23 DIAGNOSIS — I10 Essential (primary) hypertension: Secondary | ICD-10-CM | POA: Diagnosis not present

## 2017-12-23 DIAGNOSIS — Z1211 Encounter for screening for malignant neoplasm of colon: Secondary | ICD-10-CM

## 2017-12-23 DIAGNOSIS — K219 Gastro-esophageal reflux disease without esophagitis: Secondary | ICD-10-CM | POA: Diagnosis not present

## 2017-12-23 DIAGNOSIS — E876 Hypokalemia: Secondary | ICD-10-CM | POA: Diagnosis not present

## 2017-12-23 DIAGNOSIS — Z0000 Encounter for general adult medical examination without abnormal findings: Secondary | ICD-10-CM | POA: Diagnosis not present

## 2017-12-23 DIAGNOSIS — E7800 Pure hypercholesterolemia, unspecified: Secondary | ICD-10-CM

## 2017-12-23 MED ORDER — HYDROCHLOROTHIAZIDE 25 MG PO TABS: 25.0000 mg | ORAL_TABLET | Freq: Every day | ORAL | 1 refills | Status: DC

## 2017-12-23 MED ORDER — POTASSIUM CHLORIDE CRYS ER 20 MEQ PO TBCR: 20.0000 meq | EXTENDED_RELEASE_TABLET | Freq: Every day | ORAL | 1 refills | Status: DC

## 2017-12-23 MED ORDER — PANTOPRAZOLE SODIUM 40 MG PO TBEC: 40.0000 mg | DELAYED_RELEASE_TABLET | Freq: Every day | ORAL | 1 refills | Status: DC

## 2017-12-23 MED ORDER — ATENOLOL 100 MG PO TABS: 100.0000 mg | ORAL_TABLET | Freq: Every day | ORAL | 1 refills | Status: DC

## 2017-12-23 NOTE — Assessment & Plan Note (Signed)
Chronic Controlled. Continue atenolol 100mg  and HCTZ 25 mg daily. Will recheck renal panel

## 2017-12-23 NOTE — Assessment & Plan Note (Signed)
Patient supplement likely due to diuretic use.

## 2017-12-23 NOTE — Progress Notes (Signed)
Name: Toni Arroyo   MRN: [id number]    DOB: 07-Jul-1955   Date:12/23/2017       Progress Note  Subjective  Chief Complaint  Chief Complaint  Patient presents with  . Annual Exam    Patient is a 62 year old female who presents for a comprehensive physical exam. The patient reports the following problems: Knot on my back. Health maintenance has been reviewed and colonoscopy. Hypertension  Pertinent negatives include no blurred vision, chest pain, headaches, malaise/fatigue, neck pain, palpitations or shortness of breath. There are no associated agents to hypertension. Risk factors for coronary artery disease include dyslipidemia. Past treatments include beta blockers and diuretics. The current treatment provides moderate improvement. There are no compliance problems.  There is no history of angina, kidney disease, CAD/MI, CVA, heart failure, left ventricular hypertrophy, PVD or retinopathy. There is no history of chronic renal disease, a hypertension causing med or renovascular disease.  Gastroesophageal Reflux  She reports no abdominal pain, no belching, no chest pain, no choking, no coughing, no dysphagia, no globus sensation, no heartburn, no hoarse voice, no nausea, no sore throat or no wheezing. This is a new problem. The current episode started more than 1 year ago. The problem has been unchanged. The symptoms are aggravated by certain foods. Pertinent negatives include no anemia, fatigue, melena, muscle weakness, orthopnea or weight loss. Risk factors include obesity. She has tried a PPI (pantopra) for the symptoms.    Essential (primary) hypertension Chronic Controlled. Continue atenolol 100mg  and HCTZ 25 mg daily. Will recheck renal panel  Hypokalemia Patient supplement likely due to diuretic use.  Pure hypercholesterolemia Noted on previous labs. Check lipid panel today.   Past Medical History:  Diagnosis Date  . GERD (gastroesophageal reflux disease)   . Hyperlipidemia   .  Hypertension   . Hypokalemia     Past Surgical History:  Procedure Laterality Date  . CESAREAN SECTION     x 1  . VAGINAL HYSTERECTOMY      Family History  Problem Relation Age of Onset  . Diabetes Sister   . Breast cancer Neg Hx     Social History   Socioeconomic History  . Marital status: Married    Spouse name: Not on file  . Number of children: Not on file  . Years of education: Not on file  . Highest education level: Not on file  Occupational History  . Not on file  Social Needs  . Financial resource strain: Not on file  . Food insecurity:    Worry: Not on file    Inability: Not on file  . Transportation needs:    Medical: Not on file    Non-medical: Not on file  Tobacco Use  . Smoking status: Never Smoker  . Smokeless tobacco: Never Used  Substance and Sexual Activity  . Alcohol use: No    Alcohol/week: 0.0 oz  . Drug use: No  . Sexual activity: Yes  Lifestyle  . Physical activity:    Days per week: Not on file    Minutes per session: Not on file  . Stress: Not on file  Relationships  . Social connections:    Talks on phone: Not on file    Gets together: Not on file    Attends religious service: Not on file    Active member of club or organization: Not on file    Attends meetings of clubs or organizations: Not on file    Relationship status: Not on  file  . Intimate partner violence:    Fear of current or ex partner: Not on file    Emotionally abused: Not on file    Physically abused: Not on file    Forced sexual activity: Not on file  Other Topics Concern  . Not on file  Social History Narrative  . Not on file    No Known Allergies  Outpatient Medications Prior to Visit  Medication Sig Dispense Refill  . aspirin 81 MG tablet Take 81 mg by mouth daily.    . cholecalciferol (VITAMIN D) 1000 units tablet Take 1,000 Units by mouth daily.    . fluticasone (FLONASE) 50 MCG/ACT nasal spray Place 2 sprays into both nostrils daily. (Patient taking  differently: Place 2 sprays into both nostrils as needed. ) 16 g 6  . ibuprofen (ADVIL,MOTRIN) 600 MG tablet Take 1 tablet (600 mg total) by mouth every 8 (eight) hours as needed. 90 tablet 1  . meclizine (ANTIVERT) 25 MG tablet Take 1 tablet (25 mg total) by mouth 3 (three) times daily as needed for dizziness. 30 tablet 0  . atenolol (TENORMIN) 100 MG tablet Take 1 tablet (100 mg total) by mouth daily. 90 tablet 1  . hydrochlorothiazide (HYDRODIURIL) 25 MG tablet Take 1 tablet (25 mg total) by mouth daily. 90 tablet 1  . pantoprazole (PROTONIX) 40 MG tablet Take 1 tablet (40 mg total) by mouth daily. 90 tablet 1  . potassium chloride SA (KLOR-CON M20) 20 MEQ tablet Take 1 tablet (20 mEq total) by mouth daily. 90 tablet 1   No facility-administered medications prior to visit.     Review of Systems  Constitutional: Negative for chills, fatigue, fever, malaise/fatigue and weight loss.  HENT: Negative for ear discharge, ear pain, hoarse voice and sore throat.   Eyes: Negative for blurred vision.  Respiratory: Negative for cough, sputum production, choking, shortness of breath and wheezing.   Cardiovascular: Negative for chest pain, palpitations and leg swelling.  Gastrointestinal: Negative for abdominal pain, blood in stool, constipation, diarrhea, dysphagia, heartburn, melena and nausea.  Genitourinary: Negative for dysuria, frequency, hematuria and urgency.  Musculoskeletal: Negative for back pain, joint pain, myalgias, muscle weakness and neck pain.  Skin: Negative for rash.  Neurological: Negative for dizziness, tingling, sensory change, focal weakness and headaches.  Endo/Heme/Allergies: Negative for environmental allergies and polydipsia. Does not bruise/bleed easily.  Psychiatric/Behavioral: Negative for depression and suicidal ideas. The patient is not nervous/anxious and does not have insomnia.      Objective  Vitals:   12/23/17 0951  BP: 138/80  Pulse: 68  Resp: 16  SpO2: 98%   Weight: 215 lb (97.5 kg)  Height: 5\' 2"  (1.575 m)    Physical Exam  Constitutional: She is oriented to person, place, and time. She appears well-developed and well-nourished. No distress.  HENT:  Head: Normocephalic and atraumatic.  Right Ear: Hearing, tympanic membrane, external ear and ear canal normal. No decreased hearing is noted.  Left Ear: Hearing, tympanic membrane, external ear and ear canal normal. No decreased hearing is noted.  Nose: Nose normal.  Mouth/Throat: Oropharynx is clear and moist.  Eyes: Pupils are equal, round, and reactive to light. Conjunctivae, EOM and lids are normal. Right eye exhibits no discharge. Left eye exhibits no discharge.  Neck: Trachea normal, normal range of motion and full passive range of motion without pain. Neck supple. Normal carotid pulses, no hepatojugular reflux and no JVD present. No tracheal tenderness present. Carotid bruit is not present. No tracheal  deviation present. No thyroid mass and no thyromegaly present.  Cardiovascular: Normal rate, regular rhythm, S1 normal, S2 normal, normal heart sounds, intact distal pulses and normal pulses. PMI is not displaced. Exam reveals no gallop, no S3, no S4 and no friction rub.  No murmur heard.  No systolic murmur is present.  No diastolic murmur is present. Pulses:      Carotid pulses are 2+ on the right side, and 2+ on the left side.      Radial pulses are 2+ on the right side, and 2+ on the left side.       Femoral pulses are 2+ on the right side, and 2+ on the left side.      Popliteal pulses are 2+ on the right side, and 2+ on the left side.       Dorsalis pedis pulses are 2+ on the right side, and 2+ on the left side.       Posterior tibial pulses are 2+ on the right side, and 2+ on the left side.  Pulmonary/Chest: Effort normal and breath sounds normal. No stridor. No respiratory distress. She has no decreased breath sounds. She has no wheezes. She has no rhonchi. She has no rales. Right  breast exhibits no inverted nipple, no mass, no nipple discharge, no skin change and no tenderness. Left breast exhibits no inverted nipple, no mass, no nipple discharge, no skin change and no tenderness. No breast swelling, tenderness, discharge or bleeding. Breasts are symmetrical.  Abdominal: Soft. Normal appearance, normal aorta and bowel sounds are normal. She exhibits no mass. There is no hepatosplenomegaly. There is no tenderness. There is no rigidity, no rebound, no guarding and no CVA tenderness.  Genitourinary: Rectum normal. Rectal exam shows no mass and guaiac negative stool.  Musculoskeletal: Normal range of motion. She exhibits no edema.       Thoracic back: She exhibits deformity.       Lumbar back: Normal.       Back:  Palpable lipoma  Lymphadenopathy:       Head (right side): No submental and no submandibular adenopathy present.       Head (left side): No submental and no submandibular adenopathy present.    She has no cervical adenopathy.    She has no axillary adenopathy.  Neurological: She is alert and oriented to person, place, and time. She has normal strength and normal reflexes. She is not disoriented. No cranial nerve deficit or sensory deficit.  Skin: Skin is warm and dry. She is not diaphoretic. No pallor.  Psychiatric: She has a normal mood and affect.  Nursing note and vitals reviewed.     Assessment & Plan  Problem List Items Addressed This Visit      Cardiovascular and Mediastinum   Essential (primary) hypertension    Chronic Controlled. Continue atenolol 100mg  and HCTZ 25 mg daily. Will recheck renal panel      Relevant Medications   atenolol (TENORMIN) 100 MG tablet   hydrochlorothiazide (HYDRODIURIL) 25 MG tablet   Other Relevant Orders   Renal Function Panel     Other   Hypokalemia    Patient supplement likely due to diuretic use.      Relevant Medications   potassium chloride SA (KLOR-CON M20) 20 MEQ tablet   Pure hypercholesterolemia     Noted on previous labs. Check lipid panel today.      Relevant Medications   atenolol (TENORMIN) 100 MG tablet   hydrochlorothiazide (HYDRODIURIL) 25 MG tablet  Other Relevant Orders   Lipid panel    Other Visit Diagnoses    Annual physical exam    -  Primary   No subjective/objective concerns noted on physical exam.   Gastroesophageal reflux disease, esophagitis presence not specified       Controlled. Continued with pantoprazole 40 mg daily.    Relevant Medications   pantoprazole (PROTONIX) 40 MG tablet   Colon cancer screening       Relevant Orders   Ambulatory referral to Gastroenterology      Meds ordered this encounter  Medications  . atenolol (TENORMIN) 100 MG tablet    Sig: Take 1 tablet (100 mg total) by mouth daily.    Dispense:  90 tablet    Refill:  1  . hydrochlorothiazide (HYDRODIURIL) 25 MG tablet    Sig: Take 1 tablet (25 mg total) by mouth daily.    Dispense:  90 tablet    Refill:  1  . pantoprazole (PROTONIX) 40 MG tablet    Sig: Take 1 tablet (40 mg total) by mouth daily.    Dispense:  90 tablet    Refill:  1  . potassium chloride SA (KLOR-CON M20) 20 MEQ tablet    Sig: Take 1 tablet (20 mEq total) by mouth daily.    Dispense:  90 tablet    Refill:  1      Dr. Elizabeth Sauer [facility]  12/23/17

## 2017-12-23 NOTE — Assessment & Plan Note (Signed)
Noted on previous labs. Check lipid panel today.

## 2017-12-24 LAB — RENAL FUNCTION PANEL
Albumin: 4 g/dL (ref 3.6–4.8)
BUN/Creatinine Ratio: 9 — ABNORMAL LOW (ref 12–28)
BUN: 7 mg/dL — AB (ref 8–27)
CALCIUM: 9.2 mg/dL (ref 8.7–10.3)
CO2: 28 mmol/L (ref 20–29)
CREATININE: 0.75 mg/dL (ref 0.57–1.00)
Chloride: 104 mmol/L (ref 96–106)
GFR calc Af Amer: 100 mL/min/{1.73_m2} (ref >59)
GFR, EST NON AFRICAN AMERICAN: 87 mL/min/{1.73_m2} (ref >59)
Glucose: 89 mg/dL (ref 65–99)
PHOSPHORUS: 3.3 mg/dL (ref 2.5–4.5)
Potassium: 4.1 mmol/L (ref 3.5–5.2)
Sodium: 146 mmol/L — ABNORMAL HIGH (ref 134–144)

## 2017-12-24 LAB — LIPID PANEL
CHOLESTEROL TOTAL: 170 mg/dL (ref 100–199)
Chol/HDL Ratio: 2.9 ratio (ref 0.0–4.4)
HDL: 58 mg/dL (ref >39)
LDL CALC: 94 mg/dL (ref 0–99)
TRIGLYCERIDES: 88 mg/dL (ref 0–149)
VLDL Cholesterol Cal: 18 mg/dL (ref 5–40)

## 2018-01-09 ENCOUNTER — Encounter: Payer: Self-pay | Admitting: *Deleted

## 2018-01-16 ENCOUNTER — Other Ambulatory Visit: Payer: Self-pay | Admitting: Family Medicine

## 2018-01-16 DIAGNOSIS — Z1231 Encounter for screening mammogram for malignant neoplasm of breast: Secondary | ICD-10-CM

## 2018-02-14 ENCOUNTER — Ambulatory Visit
Admission: RE | Admit: 2018-02-14 | Discharge: 2018-02-14 | Disposition: A | Discharge status: Home / Self Care | Payer: BC Managed Care – PPO | Source: Ambulatory Visit | Attending: Family Medicine | Admitting: Family Medicine

## 2018-02-14 DIAGNOSIS — Z1231 Encounter for screening mammogram for malignant neoplasm of breast: Secondary | ICD-10-CM | POA: Diagnosis not present

## 2018-04-21 ENCOUNTER — Encounter: Payer: Self-pay | Admitting: Family Medicine

## 2018-04-21 ENCOUNTER — Ambulatory Visit (INDEPENDENT_AMBULATORY_CARE_PROVIDER_SITE_OTHER): Payer: BC Managed Care – PPO | Admitting: Family Medicine

## 2018-04-21 VITALS — BP 140/70 | HR 64 | Ht 62.0 in | Wt 214.0 lb

## 2018-04-21 DIAGNOSIS — S838X2A Sprain of other specified parts of left knee, initial encounter: Secondary | ICD-10-CM | POA: Diagnosis not present

## 2018-04-21 MED ORDER — MELOXICAM 7.5 MG PO TABS: 7.5000 mg | ORAL_TABLET | Freq: Every day | ORAL | 0 refills | Status: DC

## 2018-04-21 NOTE — Progress Notes (Signed)
Date:  04/21/2018   Name:  Toni Arroyo   DOB:  10-06-1955   MRN:  [id number]   Chief Complaint: Knee Pain (L) knee pain, swelling and "pulling from back of knee"- took Ibuprofen. Gets worse when laying down at night) Knee Pain   The incident occurred more than 1 week ago. There was no injury mechanism. The pain is present in the left knee. The quality of the pain is described as aching. The pain is at a severity of 8/10. The pain is moderate. The pain has been constant since onset. Pertinent negatives include no inability to bear weight, loss of motion, loss of sensation, muscle weakness, numbness or tingling. She reports no foreign bodies present. She has tried acetaminophen for the symptoms. The treatment provided moderate relief.     Review of Systems  Constitutional: Negative.  Negative for chills, fatigue, fever and unexpected weight change.  HENT: Negative for congestion, ear discharge, ear pain, rhinorrhea, sinus pressure, sneezing and sore throat.   Eyes: Negative for photophobia, pain, discharge, redness and itching.  Respiratory: Negative for cough, shortness of breath, wheezing and stridor.   Gastrointestinal: Negative for abdominal pain, blood in stool, constipation, diarrhea, nausea and vomiting.  Endocrine: Negative for cold intolerance, heat intolerance, polydipsia, polyphagia and polyuria.  Genitourinary: Negative for dysuria, flank pain, frequency, hematuria, menstrual problem, pelvic pain, urgency, vaginal bleeding and vaginal discharge.  Musculoskeletal: Positive for arthralgias. Negative for back pain and myalgias.  Skin: Negative for rash.  Allergic/Immunologic: Negative for environmental allergies and food allergies.  Neurological: Negative for dizziness, tingling, weakness, light-headedness, numbness and headaches.  Hematological: Negative for adenopathy. Does not bruise/bleed easily.  Psychiatric/Behavioral: Negative for dysphoric mood. The patient is not  nervous/anxious.     Patient Active Problem List   Diagnosis Date Noted  . Pure hypercholesterolemia 12/23/2017  . Allergic rhinitis 12/17/2014  . Encounter for general adult medical examination without abnormal findings 12/17/2014  . Essential (primary) hypertension 12/17/2014  . Esophagitis, reflux 12/17/2014  . Hypokalemia 12/17/2014  . Abnormal weight gain 12/17/2014  . Dependent edema 12/17/2014    No Known Allergies  Past Surgical History:  Procedure Laterality Date  . CESAREAN SECTION     x 1  . VAGINAL HYSTERECTOMY      Social History   Tobacco Use  . Smoking status: Never Smoker  . Smokeless tobacco: Never Used  Substance Use Topics  . Alcohol use: No    Alcohol/week: 0.0 standard drinks  . Drug use: No     Medication list has been reviewed and updated.  Current Meds  Medication Sig  . aspirin 81 MG tablet Take 81 mg by mouth daily.  Marland Kitchen atenolol (TENORMIN) 100 MG tablet Take 1 tablet (100 mg total) by mouth daily.  . cholecalciferol (VITAMIN D) 1000 units tablet Take 1,000 Units by mouth daily.  . hydrochlorothiazide (HYDRODIURIL) 25 MG tablet Take 1 tablet (25 mg total) by mouth daily.  Marland Kitchen ibuprofen (ADVIL,MOTRIN) 600 MG tablet Take 1 tablet (600 mg total) by mouth every 8 (eight) hours as needed.  . meclizine (ANTIVERT) 25 MG tablet Take 1 tablet (25 mg total) by mouth 3 (three) times daily as needed for dizziness.  . pantoprazole (PROTONIX) 40 MG tablet Take 1 tablet (40 mg total) by mouth daily.  . potassium chloride SA (KLOR-CON M20) 20 MEQ tablet Take 1 tablet (20 mEq total) by mouth daily.    PHQ 2/9 Scores 12/23/2017 10/19/2016 08/13/2015 07/04/2015  PHQ - 2 Score  0 0 0 0  PHQ- 9 Score 0 - - -    Physical Exam  Constitutional: No distress.  HENT:  Head: Normocephalic and atraumatic.  Right Ear: External ear normal.  Left Ear: External ear normal.  Nose: Nose normal.  Mouth/Throat: Oropharynx is clear and moist.  Eyes: Pupils are equal, round,  and reactive to light. Conjunctivae and EOM are normal. Right eye exhibits no discharge. Left eye exhibits no discharge.  Neck: Normal range of motion. Neck supple. No JVD present. No thyromegaly present.  Cardiovascular: Normal rate, regular rhythm, normal heart sounds and intact distal pulses. Exam reveals no gallop and no friction rub.  No murmur heard. Pulmonary/Chest: Effort normal and breath sounds normal. She has no wheezes. She has no rales.  Abdominal: Soft. Bowel sounds are normal. She exhibits no mass. There is no tenderness. There is no guarding.  Musculoskeletal: She exhibits no edema.       Left knee: She exhibits decreased range of motion and abnormal patellar mobility. She exhibits no effusion and no deformity. Tenderness found. Medial joint line and lateral joint line tenderness noted. No MCL and no LCL tenderness noted.  Extension block/ popleteal tenderness  Lymphadenopathy:    She has no cervical adenopathy.  Neurological: She is alert. She has normal reflexes.  Skin: Skin is warm and dry. She is not diaphoretic.  Nursing note and vitals reviewed.   BP 140/70   Pulse 64   Ht 5\' 2"  (1.575 m)   Wt 214 lb (97.1 kg)   BMI 39.14 kg/m   Assessment and Plan:  1. Meniscal injury, left, initial encounter Acute.  Recent exacerbation.  Will initiate meloxicam 7.5 mg once a day.  Will recheck in 2 weeks. - meloxicam (MOBIC) 7.5 MG tablet; Take 1 tablet (7.5 mg total) by mouth daily.  Dispense: 30 tablet; Refill: 0   Dr. [facility]  04/21/2018

## 2018-05-05 ENCOUNTER — Encounter: Payer: Self-pay | Admitting: Family Medicine

## 2018-05-05 ENCOUNTER — Ambulatory Visit (INDEPENDENT_AMBULATORY_CARE_PROVIDER_SITE_OTHER): Payer: BC Managed Care – PPO | Admitting: Family Medicine

## 2018-05-05 VITALS — BP 120/62 | HR 68 | Ht 62.0 in | Wt 212.0 lb

## 2018-05-05 DIAGNOSIS — M23304 Other meniscus derangements, unspecified medial meniscus, left knee: Secondary | ICD-10-CM | POA: Diagnosis not present

## 2018-05-05 NOTE — Patient Instructions (Signed)
Meniscus Tear A meniscus tear is a knee injury in which a piece of the meniscus is torn. The meniscus is a thick, rubbery, wedge-shaped cartilage in the knee. Two menisci are located in each knee. They sit between the upper bone (femur) and lower bone (tibia) that make up the knee joint. Each meniscus acts as a shock absorber for the knee. A torn meniscus is one of the most common types of knee injuries. This injury can range from mild to severe. Surgery may be needed for a severe tear. What are the causes? This injury may be caused by any squatting, twisting, or pivoting movement. Sports-related injuries are the most common cause. These often occur from:  Running and stopping suddenly.  Changing direction.  Being tackled or knocked off your feet.  As people get older, their meniscus gets thinner and weaker. In these people, tears can happen more easily, such as from climbing stairs. What increases the risk? This injury is more likely to happen to:  People who play contact sports.  Males.  People who are 30?61 years of age.  What are the signs or symptoms? Symptoms of this injury include:  Knee pain, especially at the side of the knee joint. You may feel pain when the injury occurs, or you may only hear a pop and feel pain later.  A feeling that your knee is clicking, catching, locking, or giving way.  Not being able to fully bend or extend your knee.  Bruising or swelling in your knee.  How is this diagnosed? This injury may be diagnosed based on your symptoms and a physical exam. The physical exam may include:  Moving your knee in different ways.  Feeling for tenderness.  Listening for a clicking sound.  Checking if your knee locks or catches.  You may also have tests, such as:  X-rays.  MRI.  A procedure to look inside your knee with a narrow surgical telescope (arthroscopy).  You may be referred to a knee specialist (orthopedic surgeon). How is this  treated? Treatment for this injury depends on the severity of the tear. Treatment for a mild tear may include:  Rest.  Medicine to reduce pain and swelling. This is usually a nonsteroidal anti-inflammatory drug (NSAID).  A knee brace or an elastic sleeve or wrap.  Using crutches or a walker to keep weight off your knee and to help you walk.  Exercises to strengthen your knee (physical therapy).  You may need surgery if you have a severe tear or if other treatments are not working. Follow these instructions at home: Managing pain and swelling  Take over-the-counter and prescription medicines only as told by your health care provider.  If directed, apply ice to the injured area: ? Put ice in a plastic bag. ? Place a towel between your skin and the bag. ? Leave the ice on for 20 minutes, 2-3 times per day.  Raise (elevate) the injured area above the level of your heart while you are sitting or lying down. Activity  Do not use the injured limb to support your body weight until your health care provider says that you can. Use crutches or a walker as told by your health care provider.  Return to your normal activities as told by your health care provider. Ask your health care provider what activities are safe for you.  Perform range-of-motion exercises only as told by your health care provider.  Begin doing exercises to strengthen your knee and leg muscles only   as told by your health care provider. After you recover, your health care provider may recommend these exercises to help prevent another injury. General instructions  Use a knee brace or elastic wrap as told by your health care provider.  Keep all follow-up visits as told by your health care provider. This is important. Contact a health care provider if:  You have a fever.  Your knee becomes red, tender, or swollen.  Your pain medicine is not helping.  Your symptoms get worse or do not improve after 2 weeks of home  care. This information is not intended to replace advice given to you by your health care provider. Make sure you discuss any questions you have with your health care provider. Document Released: 08/21/2002 Document Revised: 11/06/2015 Document Reviewed: 09/23/2014 [employer] Interactive Patient Education  2018 [employer]  

## 2018-05-05 NOTE — Progress Notes (Signed)
Date:  05/05/2018   Name:  Toni Arroyo   DOB:  1955/08/04   MRN:  [id number]   Chief Complaint: Follow-up (knee pain- gave meloxicam for 30 day trial- helps with the pain along with a brace she is wearing- referral to ortho) Leg Pain   The incident occurred more than 1 week ago. There was no injury mechanism. The pain is present in the left knee. The quality of the pain is described as aching. The pain is moderate. The pain has been intermittent since onset. Associated symptoms include muscle weakness. Pertinent negatives include no inability to bear weight, loss of motion, loss of sensation, numbness or tingling. Associated symptoms comments: Needs brace. The symptoms are aggravated by movement and weight bearing. She has tried NSAIDs for the symptoms. The treatment provided moderate relief.     Review of Systems  Neurological: Negative for tingling and numbness.    Patient Active Problem List   Diagnosis Date Noted  . Pure hypercholesterolemia 12/23/2017  . Allergic rhinitis 12/17/2014  . Encounter for general adult medical examination without abnormal findings 12/17/2014  . Essential (primary) hypertension 12/17/2014  . Esophagitis, reflux 12/17/2014  . Hypokalemia 12/17/2014  . Abnormal weight gain 12/17/2014  . Dependent edema 12/17/2014    No Known Allergies  Past Surgical History:  Procedure Laterality Date  . CESAREAN SECTION     x 1  . VAGINAL HYSTERECTOMY      Social History   Tobacco Use  . Smoking status: Never Smoker  . Smokeless tobacco: Never Used  Substance Use Topics  . Alcohol use: No    Alcohol/week: 0.0 standard drinks  . Drug use: No     Medication list has been reviewed and updated.  Current Meds  Medication Sig  . aspirin 81 MG tablet Take 81 mg by mouth daily.  Marland Kitchen atenolol (TENORMIN) 100 MG tablet Take 1 tablet (100 mg total) by mouth daily.  . cholecalciferol (VITAMIN D) 1000 units tablet Take 1,000 Units by mouth daily.  .  fluticasone (FLONASE) 50 MCG/ACT nasal spray Place 2 sprays into both nostrils daily.  . hydrochlorothiazide (HYDRODIURIL) 25 MG tablet Take 1 tablet (25 mg total) by mouth daily.  Marland Kitchen ibuprofen (ADVIL,MOTRIN) 600 MG tablet Take 1 tablet (600 mg total) by mouth every 8 (eight) hours as needed.  . meclizine (ANTIVERT) 25 MG tablet Take 1 tablet (25 mg total) by mouth 3 (three) times daily as needed for dizziness.  . meloxicam (MOBIC) 7.5 MG tablet Take 1 tablet (7.5 mg total) by mouth daily.  . pantoprazole (PROTONIX) 40 MG tablet Take 1 tablet (40 mg total) by mouth daily.  . potassium chloride SA (KLOR-CON M20) 20 MEQ tablet Take 1 tablet (20 mEq total) by mouth daily.    PHQ 2/9 Scores 12/23/2017 10/19/2016 08/13/2015 07/04/2015  PHQ - 2 Score 0 0 0 0  PHQ- 9 Score 0 - - -    Physical Exam  Constitutional: She is oriented to person, place, and time. She appears well-developed and well-nourished.  HENT:  Head: Normocephalic.  Right Ear: External ear normal.  Left Ear: External ear normal.  Mouth/Throat: Oropharynx is clear and moist.  Eyes: Pupils are equal, round, and reactive to light. Conjunctivae and EOM are normal. Lids are everted and swept, no foreign bodies found. Left eye exhibits no hordeolum. No foreign body present in the left eye. Right conjunctiva is not injected. Left conjunctiva is not injected. No scleral icterus.  Neck: Normal range of  motion. Neck supple. No JVD present. No tracheal deviation present. No thyromegaly present.  Cardiovascular: Normal rate, regular rhythm, normal heart sounds and intact distal pulses. Exam reveals no gallop and no friction rub.  No murmur heard. Pulmonary/Chest: Effort normal and breath sounds normal. No respiratory distress. She has no wheezes. She has no rales.  Abdominal: Soft. Bowel sounds are normal. She exhibits no mass. There is no hepatosplenomegaly. There is no tenderness. There is no rebound and no guarding.  Musculoskeletal: She  exhibits no edema.       Left knee: She exhibits decreased range of motion. She exhibits no swelling, no effusion, no ecchymosis and no deformity. Tenderness found. Medial joint line tenderness noted. No lateral joint line, no MCL, no LCL and no patellar tendon tenderness noted.  Lymphadenopathy:    She has no cervical adenopathy.  Neurological: She is alert and oriented to person, place, and time. She has normal strength. She displays normal reflexes. No cranial nerve deficit.  Skin: Skin is warm. No rash noted.  Psychiatric: She has a normal mood and affect. Her mood appears not anxious. She does not exhibit a depressed mood.  Nursing note and vitals reviewed.   BP 120/62   Pulse 68   Ht 5\' 2"  (1.575 m)   Wt 212 lb (96.2 kg)   BMI 38.78 kg/m   Assessment and Plan:  1. Meniscus, medial, derangement, left Referral to ortho - Ambulatory referral to Orthopedic Surgery   Dr. Elizabeth Sauereanna Leyton [facility]  05/05/2018

## 2018-05-25 ENCOUNTER — Telehealth: Payer: Self-pay

## 2018-05-25 DIAGNOSIS — J0100 Acute maxillary sinusitis, unspecified: Secondary | ICD-10-CM

## 2018-05-25 MED ORDER — AZITHROMYCIN 250 MG PO TABS: ORAL_TABLET | ORAL | 0 refills | Status: DC

## 2018-05-25 NOTE — Telephone Encounter (Signed)
Called wanting a ZPack sent in- she was here twice in November- sent in, but told she will need to be seen if not better.

## 2018-08-28 ENCOUNTER — Other Ambulatory Visit: Payer: Self-pay | Admitting: Family Medicine

## 2018-08-28 DIAGNOSIS — I10 Essential (primary) hypertension: Secondary | ICD-10-CM

## 2018-08-28 DIAGNOSIS — K219 Gastro-esophageal reflux disease without esophagitis: Secondary | ICD-10-CM

## 2018-09-07 ENCOUNTER — Other Ambulatory Visit: Payer: Self-pay | Admitting: Family Medicine

## 2018-09-07 DIAGNOSIS — E876 Hypokalemia: Secondary | ICD-10-CM

## 2018-09-12 ENCOUNTER — Encounter: Payer: Self-pay | Admitting: Family Medicine

## 2018-09-12 ENCOUNTER — Ambulatory Visit (INDEPENDENT_AMBULATORY_CARE_PROVIDER_SITE_OTHER): Payer: BC Managed Care – PPO | Admitting: Family Medicine

## 2018-09-12 ENCOUNTER — Other Ambulatory Visit: Payer: Self-pay

## 2018-09-12 VITALS — BP 120/62 | HR 64 | Ht 62.0 in | Wt 208.0 lb

## 2018-09-12 DIAGNOSIS — I10 Essential (primary) hypertension: Secondary | ICD-10-CM

## 2018-09-12 DIAGNOSIS — R69 Illness, unspecified: Secondary | ICD-10-CM

## 2018-09-12 DIAGNOSIS — M5136 Other intervertebral disc degeneration, lumbar region: Secondary | ICD-10-CM | POA: Insufficient documentation

## 2018-09-12 DIAGNOSIS — K219 Gastro-esophageal reflux disease without esophagitis: Secondary | ICD-10-CM

## 2018-09-12 DIAGNOSIS — E876 Hypokalemia: Secondary | ICD-10-CM

## 2018-09-12 DIAGNOSIS — R42 Dizziness and giddiness: Secondary | ICD-10-CM

## 2018-09-12 DIAGNOSIS — M51369 Other intervertebral disc degeneration, lumbar region without mention of lumbar back pain or lower extremity pain: Secondary | ICD-10-CM | POA: Insufficient documentation

## 2018-09-12 DIAGNOSIS — M150 Primary generalized (osteo)arthritis: Secondary | ICD-10-CM

## 2018-09-12 DIAGNOSIS — M159 Polyosteoarthritis, unspecified: Secondary | ICD-10-CM | POA: Insufficient documentation

## 2018-09-12 MED ORDER — MECLIZINE HCL 25 MG PO TABS: 25.0000 mg | ORAL_TABLET | Freq: Three times a day (TID) | ORAL | 5 refills | Status: AC | PRN

## 2018-09-12 MED ORDER — POTASSIUM CHLORIDE CRYS ER 20 MEQ PO TBCR: EXTENDED_RELEASE_TABLET | ORAL | 5 refills | Status: DC

## 2018-09-12 MED ORDER — PANTOPRAZOLE SODIUM 40 MG PO TBEC: DELAYED_RELEASE_TABLET | ORAL | 5 refills | Status: DC

## 2018-09-12 MED ORDER — IBUPROFEN 600 MG PO TABS: 600.0000 mg | ORAL_TABLET | Freq: Three times a day (TID) | ORAL | 1 refills | Status: DC | PRN

## 2018-09-12 MED ORDER — HYDROCHLOROTHIAZIDE 25 MG PO TABS: 25.0000 mg | ORAL_TABLET | Freq: Every day | ORAL | 5 refills | Status: DC

## 2018-09-12 MED ORDER — ATENOLOL 100 MG PO TABS: ORAL_TABLET | ORAL | 5 refills | Status: DC

## 2018-09-12 NOTE — Progress Notes (Signed)
Date:  09/12/2018   Name:  Toni Arroyo   DOB:  [birth year]   MRN:  [id number]   Chief Complaint: Hypertension; Arthritis (refill ibuprofen); Dizziness; Gastroesophageal Reflux; and hypokalemia  Hypertension  This is a chronic problem. The current episode started more than 1 year ago. The problem has been gradually worsening since onset. The problem is controlled. Pertinent negatives include no anxiety, blurred vision, chest pain, headaches, malaise/fatigue, neck pain, orthopnea, palpitations, peripheral edema, PND, shortness of breath or sweats. There are no associated agents to hypertension. Past treatments include diuretics and beta blockers. The current treatment provides moderate improvement. There are no compliance problems.  There is no history of angina, kidney disease, CAD/MI, CVA, heart failure, left ventricular hypertrophy, PVD or retinopathy. There is no history of chronic renal disease, a hypertension causing med or renovascular disease.  Arthritis  Presents for follow-up visit. She complains of pain and stiffness. She reports no joint swelling or joint warmth. The symptoms have been stable. Affected locations include the left hip and right hip. Her pain is at a severity of 3/10. Pertinent negatives include no diarrhea, dry eyes, dry mouth, dysuria, fatigue, fever, pain at night, pain while resting, rash, Raynaud's syndrome, uveitis or weight loss. Compliance problems include medication cost.   Dizziness  This is a chronic problem. The problem occurs rarely. The problem has been waxing and waning. Pertinent negatives include no abdominal pain, anorexia, arthralgias, chest pain, chills, congestion, coughing, fatigue, fever, headaches, joint swelling, myalgias, nausea, neck pain, numbness, rash, sore throat, swollen glands, vomiting or weakness. Nothing aggravates the symptoms. She has tried nothing for the symptoms. The treatment provided mild relief.  Gastroesophageal Reflux  She  reports no abdominal pain, no belching, no chest pain, no choking, no coughing, no dysphagia, no early satiety, no globus sensation, no heartburn, no hoarse voice, no nausea, no sore throat, no stridor, no tooth decay, no water brash or no wheezing. This is a new problem. The current episode started today. The problem occurs occasionally. The problem has been waxing and waning. The symptoms are aggravated by certain foods. Pertinent negatives include no fatigue, melena, muscle weakness, orthopnea or weight loss. She has tried a PPI for the symptoms. The treatment provided moderate relief.    Review of Systems  Constitutional: Negative.  Negative for chills, fatigue, fever, malaise/fatigue, unexpected weight change and weight loss.  HENT: Negative for congestion, ear discharge, ear pain, hoarse voice, rhinorrhea, sinus pressure, sneezing and sore throat.   Eyes: Negative for blurred vision, photophobia, pain, discharge, redness and itching.  Respiratory: Negative for cough, choking, shortness of breath, wheezing and stridor.   Cardiovascular: Negative for chest pain, palpitations, orthopnea and PND.  Gastrointestinal: Negative for abdominal pain, anorexia, blood in stool, constipation, diarrhea, dysphagia, heartburn, melena, nausea and vomiting.  Endocrine: Negative for cold intolerance, heat intolerance, polydipsia, polyphagia and polyuria.  Genitourinary: Negative for dysuria, flank pain, frequency, hematuria, menstrual problem, pelvic pain, urgency, vaginal bleeding and vaginal discharge.  Musculoskeletal: Positive for arthritis and stiffness. Negative for arthralgias, back pain, joint swelling, myalgias, muscle weakness and neck pain.  Skin: Negative for rash.  Allergic/Immunologic: Negative for environmental allergies and food allergies.  Neurological: Positive for dizziness. Negative for weakness, light-headedness, numbness and headaches.  Hematological: Negative for adenopathy. Does not  bruise/bleed easily.  Psychiatric/Behavioral: Negative for dysphoric mood. The patient is not nervous/anxious.     Patient Active Problem List   Diagnosis Date Noted  . Pure hypercholesterolemia 12/23/2017  . Allergic  rhinitis 12/17/2014  . Encounter for general adult medical examination without abnormal findings 12/17/2014  . Essential (primary) hypertension 12/17/2014  . Esophagitis, reflux 12/17/2014  . Hypokalemia 12/17/2014  . Abnormal weight gain 12/17/2014  . Dependent edema 12/17/2014    No Known Allergies  Past Surgical History:  Procedure Laterality Date  . CESAREAN SECTION     x 1  . VAGINAL HYSTERECTOMY      Social History   Tobacco Use  . Smoking status: Never Smoker  . Smokeless tobacco: Never Used  Substance Use Topics  . Alcohol use: No    Alcohol/week: 0.0 standard drinks  . Drug use: No     Medication list has been reviewed and updated.  Current Meds  Medication Sig  . aspirin 81 MG tablet Take 81 mg by mouth daily.  Marland Kitchen atenolol (TENORMIN) 100 MG tablet TAKE ONE (1) TABLET BY MOUTH ONCE DAILY  . cholecalciferol (VITAMIN D) 1000 units tablet Take 1,000 Units by mouth daily.  . hydrochlorothiazide (HYDRODIURIL) 25 MG tablet TAKE ONE (1) TABLET BY MOUTH ONCE DAILY  . ibuprofen (ADVIL,MOTRIN) 600 MG tablet Take 1 tablet (600 mg total) by mouth every 8 (eight) hours as needed.  . meclizine (ANTIVERT) 25 MG tablet Take 1 tablet (25 mg total) by mouth 3 (three) times daily as needed for dizziness.  . pantoprazole (PROTONIX) 40 MG tablet TAKE ONE (1) TABLET BY MOUTH ONCE DAILY  . potassium chloride SA (K-DUR,KLOR-CON) 20 MEQ tablet TAKE ONE (1) TABLET BY MOUTH ONCE DAILY  . [DISCONTINUED] fluticasone (FLONASE) 50 MCG/ACT nasal spray Place 2 sprays into both nostrils daily.  . [DISCONTINUED] meloxicam (MOBIC) 7.5 MG tablet Take 1 tablet (7.5 mg total) by mouth daily.    PHQ 2/9 Scores 12/23/2017 10/19/2016 08/13/2015 07/04/2015  PHQ - 2 Score 0 0 0 0  PHQ- 9  Score 0 - - -    BP Readings from Last 3 Encounters:  09/12/18 120/62  05/05/18 120/62  04/21/18 140/70    Physical Exam Vitals signs and nursing note reviewed.  Constitutional:      General: She is not in acute distress.    Appearance: She is not diaphoretic.  HENT:     Head: Normocephalic and atraumatic.     Right Ear: External ear normal.     Left Ear: External ear normal.     Nose: Nose normal.  Eyes:     General:        Right eye: No discharge.        Left eye: No discharge.     Conjunctiva/sclera: Conjunctivae normal.     Pupils: Pupils are equal, round, and reactive to light.  Neck:     Musculoskeletal: Normal range of motion and neck supple.     Thyroid: No thyromegaly.     Vascular: No JVD.  Cardiovascular:     Rate and Rhythm: Normal rate and regular rhythm.     Heart sounds: Normal heart sounds. No murmur. No friction rub. No gallop.   Pulmonary:     Effort: Pulmonary effort is normal.     Breath sounds: Normal breath sounds.  Abdominal:     General: Bowel sounds are normal.     Palpations: Abdomen is soft. There is no mass.     Tenderness: There is no abdominal tenderness. There is no guarding.  Musculoskeletal: Normal range of motion.  Lymphadenopathy:     Cervical: No cervical adenopathy.  Skin:    General: Skin is warm and dry.  Neurological:     Mental Status: She is alert.     Deep Tendon Reflexes: Reflexes are normal and symmetric.     Wt Readings from Last 3 Encounters:  09/12/18 208 lb (94.3 kg)  05/05/18 212 lb (96.2 kg)  04/21/18 214 lb (97.1 kg)    BP 120/62   Pulse 64   Ht 5\' 2"  (1.575 m)   Wt 208 lb (94.3 kg)   BMI 38.04 kg/m   Assessment and Plan: 1. Essential (primary) hypertension Chronic.  Controlled.  Continue atenolol 100 mg once a day and hydrochlorothiazide 25 mg once a day will check renal function panel. - Renal Function Panel - Lipid Panel With LDL/HDL Ratio - atenolol (TENORMIN) 100 MG tablet; TAKE ONE (1) TABLET  BY MOUTH ONCE DAILY  Dispense: 30 tablet; Refill: 5 - hydrochlorothiazide (HYDRODIURIL) 25 MG tablet; Take 1 tablet (25 mg total) by mouth daily.  Dispense: 30 tablet; Refill: 5  2. Primary osteoarthritis involving multiple joints Chronic.  Episodic.  Patient takes ibuprofen 600 mg as needed every 8 hours. - ibuprofen (ADVIL,MOTRIN) 600 MG tablet; Take 1 tablet (600 mg total) by mouth every 8 (eight) hours as needed.  Dispense: 90 tablet; Refill: 1  3. Degenerative lumbar disc Chronic.  Episodic flare.  Continue ibuprofen 600 mg every 8 hours as needed - ibuprofen (ADVIL,MOTRIN) 600 MG tablet; Take 1 tablet (600 mg total) by mouth every 8 (eight) hours as needed.  Dispense: 90 tablet; Refill: 1  4. Gastroesophageal reflux disease, esophagitis presence not specified Chronic.  Controlled.  Continue Protonix 40 mg once a day - pantoprazole (PROTONIX) 40 MG tablet; TAKE ONE (1) TABLET BY MOUTH ONCE DAILY  Dispense: 30 tablet; Refill: 5  5. Hypokalemia Patient has hypokalemia secondary to thiazide diuretic.  Will supplement with K-Dur 20 mEq 1 a day - Renal Function Panel - potassium chloride SA (K-DUR,KLOR-CON) 20 MEQ tablet; TAKE ONE (1) TABLET BY MOUTH ONCE DAILY  Dispense: 30 tablet; Refill: 5  6. Taking medication for chronic disease Check hepatic function pending medications that patient is currently taking. - Hepatic function panel  7. Vertigo Neck.  Episodic.  Refill meclizine 25 mg may be taken 3 times a day as needed for vertigo. - meclizine (ANTIVERT) 25 MG tablet; Take 1 tablet (25 mg total) by mouth 3 (three) times daily as needed for dizziness.  Dispense: 30 tablet; Refill: 5

## 2018-09-13 LAB — RENAL FUNCTION PANEL
Albumin: 4.2 g/dL (ref 3.8–4.8)
BUN/Creatinine Ratio: 11 — ABNORMAL LOW (ref 12–28)
BUN: 8 mg/dL (ref 8–27)
CO2: 29 mmol/L (ref 20–29)
Calcium: 9.6 mg/dL (ref 8.7–10.3)
Chloride: 103 mmol/L (ref 96–106)
Creatinine, Ser: 0.72 mg/dL (ref 0.57–1.00)
GFR calc Af Amer: 105 mL/min/{1.73_m2} (ref >59)
GFR calc non Af Amer: 91 mL/min/{1.73_m2} (ref >59)
Glucose: 91 mg/dL (ref 65–99)
Phosphorus: 3.2 mg/dL (ref 3.0–4.3)
Potassium: 3.8 mmol/L (ref 3.5–5.2)
Sodium: 148 mmol/L — ABNORMAL HIGH (ref 134–144)

## 2018-09-13 LAB — LIPID PANEL WITH LDL/HDL RATIO
Cholesterol, Total: 187 mg/dL (ref 100–199)
HDL: 54 mg/dL (ref >39)
LDL Calculated: 115 mg/dL — ABNORMAL HIGH (ref 0–99)
LDl/HDL Ratio: 2.1 ratio (ref 0.0–3.2)
TRIGLYCERIDES: 89 mg/dL (ref 0–149)
VLDL Cholesterol Cal: 18 mg/dL (ref 5–40)

## 2018-09-13 LAB — HEPATIC FUNCTION PANEL
ALT: 12 IU/L (ref 0–32)
AST: 18 IU/L (ref 0–40)
Alkaline Phosphatase: 71 IU/L (ref 39–117)
Bilirubin Total: 0.5 mg/dL (ref 0.0–1.2)
Bilirubin, Direct: 0.14 mg/dL (ref 0.00–0.40)
Total Protein: 7.1 g/dL (ref 6.0–8.5)

## 2019-04-02 ENCOUNTER — Other Ambulatory Visit: Payer: Self-pay | Admitting: Family Medicine

## 2019-04-02 ENCOUNTER — Other Ambulatory Visit: Payer: Self-pay | Admitting: Internal Medicine

## 2019-04-02 DIAGNOSIS — Z1231 Encounter for screening mammogram for malignant neoplasm of breast: Secondary | ICD-10-CM

## 2019-05-03 ENCOUNTER — Ambulatory Visit
Admission: RE | Admit: 2019-05-03 | Discharge: 2019-05-03 | Disposition: A | Discharge status: Home / Self Care | Payer: BC Managed Care – PPO | Source: Ambulatory Visit | Attending: Family Medicine | Admitting: Family Medicine

## 2019-05-03 DIAGNOSIS — Z1231 Encounter for screening mammogram for malignant neoplasm of breast: Secondary | ICD-10-CM | POA: Insufficient documentation

## 2019-05-15 ENCOUNTER — Ambulatory Visit (INDEPENDENT_AMBULATORY_CARE_PROVIDER_SITE_OTHER): Payer: BC Managed Care – PPO | Admitting: Family Medicine

## 2019-05-15 ENCOUNTER — Encounter: Payer: Self-pay | Admitting: Family Medicine

## 2019-05-15 ENCOUNTER — Other Ambulatory Visit: Payer: Self-pay

## 2019-05-15 VITALS — BP 120/62 | HR 72 | Ht 62.0 in | Wt 213.0 lb

## 2019-05-15 DIAGNOSIS — Z23 Encounter for immunization: Secondary | ICD-10-CM

## 2019-05-15 DIAGNOSIS — Z0000 Encounter for general adult medical examination without abnormal findings: Secondary | ICD-10-CM

## 2019-05-15 NOTE — Progress Notes (Signed)
Date:  05/15/2019   Name:  Toni Arroyo   DOB:  04/17/1956   MRN:  [id number]   Chief Complaint: Annual Exam  Patient is a 63 year old female who presents for a comprehensive physical exam. The patient reports the following problems: none. Health maintenance has been reviewed needs colonoscopy.   Lab Results  Component Value Date   CREATININE 0.72 09/12/2018   BUN 8 09/12/2018   NA 148 (H) 09/12/2018   K 3.8 09/12/2018   CL 103 09/12/2018   CO2 29 09/12/2018   Lab Results  Component Value Date   CHOL 187 09/12/2018   HDL 54 09/12/2018   LDLCALC 115 (H) 09/12/2018   TRIG 89 09/12/2018   CHOLHDL 2.9 12/23/2017   No results found for: TSH No results found for: HGBA1C   Review of Systems  Constitutional: Negative for chills and fever.  HENT: Negative for drooling, ear discharge, ear pain and sore throat.   Respiratory: Negative for cough, shortness of breath and wheezing.   Cardiovascular: Negative for chest pain, palpitations and leg swelling.  Gastrointestinal: Negative for abdominal pain, blood in stool, constipation, diarrhea and nausea.  Endocrine: Negative for polydipsia.  Genitourinary: Negative for dysuria, frequency, hematuria and urgency.  Musculoskeletal: Negative for back pain, myalgias and neck pain.  Skin: Negative for rash.  Allergic/Immunologic: Negative for environmental allergies.  Neurological: Negative for dizziness and headaches.  Hematological: Does not bruise/bleed easily.  Psychiatric/Behavioral: Negative for suicidal ideas. The patient is not nervous/anxious.     Patient Active Problem List   Diagnosis Date Noted  . Gastroesophageal reflux disease 09/12/2018  . Primary osteoarthritis involving multiple joints 09/12/2018  . Degenerative lumbar disc 09/12/2018  . Pure hypercholesterolemia 12/23/2017  . Allergic rhinitis 12/17/2014  . Encounter for general adult medical examination without abnormal findings 12/17/2014  . Essential  (primary) hypertension 12/17/2014  . Esophagitis, reflux 12/17/2014  . Hypokalemia 12/17/2014  . Abnormal weight gain 12/17/2014  . Dependent edema 12/17/2014    No Known Allergies  Past Surgical History:  Procedure Laterality Date  . CESAREAN SECTION     x 1  . VAGINAL HYSTERECTOMY      Social History   Tobacco Use  . Smoking status: Never Smoker  . Smokeless tobacco: Never Used  Substance Use Topics  . Alcohol use: No    Alcohol/week: 0.0 standard drinks  . Drug use: No     Medication list has been reviewed and updated.  Current Meds  Medication Sig  . aspirin 81 MG tablet Take 81 mg by mouth daily.  Marland Kitchen atenolol (TENORMIN) 100 MG tablet TAKE ONE (1) TABLET BY MOUTH ONCE DAILY  . cholecalciferol (VITAMIN D) 1000 units tablet Take 1,000 Units by mouth daily.  . hydrochlorothiazide (HYDRODIURIL) 25 MG tablet Take 1 tablet (25 mg total) by mouth daily.  Marland Kitchen ibuprofen (ADVIL,MOTRIN) 600 MG tablet Take 1 tablet (600 mg total) by mouth every 8 (eight) hours as needed.  . meclizine (ANTIVERT) 25 MG tablet Take 1 tablet (25 mg total) by mouth 3 (three) times daily as needed for dizziness.  . pantoprazole (PROTONIX) 40 MG tablet TAKE ONE (1) TABLET BY MOUTH ONCE DAILY  . potassium chloride SA (K-DUR,KLOR-CON) 20 MEQ tablet TAKE ONE (1) TABLET BY MOUTH ONCE DAILY    PHQ 2/9 Scores 05/15/2019 12/23/2017 10/19/2016 08/13/2015  PHQ - 2 Score 0 0 0 0  PHQ- 9 Score 0 0 - -    BP Readings from Last 3 Encounters:  05/15/19 120/62  09/12/18 120/62  05/05/18 120/62    Physical Exam Vitals signs and nursing note reviewed.  Constitutional:      Appearance: She is well-developed. She is obese.  HENT:     Head: Normocephalic.     Jaw: There is normal jaw occlusion.     Right Ear: Hearing, tympanic membrane, ear canal and external ear normal. There is no impacted cerumen.     Left Ear: Hearing, tympanic membrane, ear canal and external ear normal. There is no impacted cerumen.     Nose:  Nose normal. No congestion or rhinorrhea.     Mouth/Throat:     Lips: Pink.     Mouth: Mucous membranes are moist. No oral lesions.     Dentition: Normal dentition.     Tongue: No lesions.     Palate: No mass.     Pharynx: Oropharynx is clear. Uvula midline.  Eyes:     General: Lids are normal. Vision grossly intact. Gaze aligned appropriately. No scleral icterus.       Left eye: No foreign body or hordeolum.     Extraocular Movements: Extraocular movements intact.     Conjunctiva/sclera: Conjunctivae normal.     Right eye: Right conjunctiva is not injected.     Left eye: Left conjunctiva is not injected.     Pupils: Pupils are equal, round, and reactive to light.     Funduscopic exam:    Right eye: Red reflex present.        Left eye: Red reflex present. Neck:     Musculoskeletal: Full passive range of motion without pain, normal range of motion and neck supple.     Thyroid: No thyroid mass, thyromegaly or thyroid tenderness.     Vascular: Normal carotid pulses. No carotid bruit, hepatojugular reflux or JVD.     Trachea: Trachea and phonation normal. No tracheal deviation.  Cardiovascular:     Rate and Rhythm: Normal rate and regular rhythm.     Chest Wall: PMI is not displaced. No thrill.     Pulses: Normal pulses.          Carotid pulses are 2+ on the right side and 2+ on the left side.      Radial pulses are 2+ on the right side and 2+ on the left side.       Femoral pulses are 2+ on the right side and 2+ on the left side.      Popliteal pulses are 2+ on the right side and 2+ on the left side.       Dorsalis pedis pulses are 2+ on the right side and 2+ on the left side.       Posterior tibial pulses are 2+ on the right side and 2+ on the left side.     Heart sounds: Normal heart sounds, S1 normal and S2 normal. No murmur. No systolic murmur. No diastolic murmur. No friction rub. No gallop. No S3 or S4 sounds.   Pulmonary:     Effort: Pulmonary effort is normal. No respiratory  distress.     Breath sounds: Normal breath sounds. No decreased air movement. No decreased breath sounds, wheezing, rhonchi or rales.  Chest:     Chest wall: No mass.     Breasts:        Right: Normal. No swelling, bleeding, inverted nipple, mass, nipple discharge, skin change or tenderness.        Left: Normal. No swelling, bleeding, inverted nipple,  mass, nipple discharge, skin change or tenderness.  Abdominal:     General: Bowel sounds are normal.     Palpations: Abdomen is soft. There is no hepatomegaly, splenomegaly or mass.     Tenderness: There is no abdominal tenderness. There is no guarding or rebound.  Genitourinary:    Labia:        Right: No rash, tenderness or lesion.        Left: No rash, tenderness or lesion.      Rectum: Normal. Guaiac result negative. No mass.  Musculoskeletal: Normal range of motion.        General: No tenderness.     Cervical back: Normal.     Thoracic back: Normal.     Lumbar back: Normal.     Right lower leg: No edema.     Left lower leg: No edema.  Lymphadenopathy:     Head:     Right side of head: No submandibular adenopathy.     Left side of head: No submandibular adenopathy.     Cervical: No cervical adenopathy.     Right cervical: No superficial, deep or posterior cervical adenopathy.    Left cervical: No superficial, deep or posterior cervical adenopathy.     Upper Body:     Right upper body: No supraclavicular, axillary or pectoral adenopathy.     Left upper body: No supraclavicular, axillary or pectoral adenopathy.     Lower Body: No right inguinal adenopathy. No left inguinal adenopathy.  Skin:    General: Skin is warm.     Capillary Refill: Capillary refill takes less than 2 seconds.     Findings: No rash. Rash is not crusting.  Neurological:     Mental Status: She is alert and oriented to person, place, and time.     Cranial Nerves: Cranial nerves are intact. No cranial nerve deficit.     Sensory: Sensation is intact.     Deep  Tendon Reflexes: Reflexes are normal and symmetric. Reflexes normal.     Reflex Scores:      Tricep reflexes are 2+ on the right side and 2+ on the left side.      Bicep reflexes are 2+ on the right side and 2+ on the left side.      Brachioradialis reflexes are 2+ on the right side and 2+ on the left side.      Patellar reflexes are 2+ on the right side and 2+ on the left side.      Achilles reflexes are 2+ on the right side and 2+ on the left side. Psychiatric:        Mood and Affect: Mood is not anxious or depressed.     Wt Readings from Last 3 Encounters:  05/15/19 213 lb (96.6 kg)  09/12/18 208 lb (94.3 kg)  05/05/18 212 lb (96.2 kg)    BP 120/62   Pulse 72   Ht  (1.575 m)   Wt 213 lb (96.6 kg)   BMI 38.96 kg/m   Assessment and Plan: 1. Annual physical exam No subjective objective concerns noted during history and physical.  Patient's previous encounters and labs were reviewed.  Patient's medications have been refilled relatively recently and will be seen later on in the early part of next year.KASEY HANSELL is a 63 y.o. female who presents today for her Complete Annual Exam. She feels well. She reports exercising . She reports she is sleeping well. Immunizations are reviewed and recommendations provided.  Age appropriate screening tests are discussed. Counseling given for risk factor reduction interventions.  2. Influenza vaccine needed Discussed and administered - Flu Vaccine QUAD 6+ mos PF IM (Fluarix Quad PF)

## 2019-05-21 ENCOUNTER — Ambulatory Visit (INDEPENDENT_AMBULATORY_CARE_PROVIDER_SITE_OTHER): Payer: BC Managed Care – PPO | Admitting: Family Medicine

## 2019-05-21 ENCOUNTER — Other Ambulatory Visit: Payer: Self-pay

## 2019-05-21 ENCOUNTER — Encounter: Payer: Self-pay | Admitting: Family Medicine

## 2019-05-21 ENCOUNTER — Telehealth: Payer: Self-pay

## 2019-05-21 VITALS — Temp 98.9°F | Ht 62.0 in | Wt 213.0 lb

## 2019-05-21 DIAGNOSIS — J0100 Acute maxillary sinusitis, unspecified: Secondary | ICD-10-CM | POA: Diagnosis not present

## 2019-05-21 DIAGNOSIS — R05 Cough: Secondary | ICD-10-CM

## 2019-05-21 DIAGNOSIS — R059 Cough, unspecified: Secondary | ICD-10-CM

## 2019-05-21 MED ORDER — AMOXICILLIN 500 MG PO CAPS: 500.0000 mg | ORAL_CAPSULE | Freq: Three times a day (TID) | ORAL | 0 refills | Status: DC

## 2019-05-21 MED ORDER — GUAIFENESIN-CODEINE 100-10 MG/5ML PO SYRP: 5.0000 mL | ORAL_SOLUTION | Freq: Four times a day (QID) | ORAL | 0 refills | Status: DC | PRN

## 2019-05-21 NOTE — Progress Notes (Addendum)
Date:  05/21/2019   Name:  Toni Arroyo   DOB:  08/09/1955   MRN:  [id number]   Chief Complaint: Sinusitis (sinus drainage, cough from drainage- nothing otc)  I connected withthis patient, Toni Arroyo, by telephoneat the patient's home.  I verified that I am speaking with the correct person using two identifiers. This visit was conducted via telephone due to the Covid-19 outbreak from my office at [facility] in [place], [place]. I discussed the limitations, risks, security and privacy concerns of performing an evaluation and management service by telephone. I also discussed with the patient that there may be a patient responsible charge related to this service. The patient expressed understanding and agreed to proceed.  Sinusitis This is a new problem. The current episode started yesterday. The problem has been gradually worsening since onset. There has been no fever. Associated symptoms include congestion, headaches and sinus pressure. Pertinent negatives include no chills, coughing, diaphoresis, ear pain, hoarse voice, neck pain, shortness of breath, sneezing, sore throat or swollen glands. Past treatments include nothing.    Lab Results  Component Value Date   CREATININE 0.72 09/12/2018   BUN 8 09/12/2018   NA 148 (H) 09/12/2018   K 3.8 09/12/2018   CL 103 09/12/2018   CO2 29 09/12/2018   Lab Results  Component Value Date   CHOL 187 09/12/2018   HDL 54 09/12/2018   LDLCALC 115 (H) 09/12/2018   TRIG 89 09/12/2018   CHOLHDL 2.9 12/23/2017   No results found for: TSH No results found for: HGBA1C   Review of Systems  Constitutional: Negative.  Negative for chills, diaphoresis, fatigue, fever and unexpected weight change.  HENT: Positive for congestion and sinus pressure. Negative for ear discharge, ear pain, hoarse voice, rhinorrhea, sneezing and sore throat.   Eyes: Negative for photophobia, pain, discharge, redness and itching.  Respiratory: Negative for cough,  shortness of breath, wheezing and stridor.   Gastrointestinal: Negative for abdominal pain, blood in stool, constipation, diarrhea, nausea and vomiting.  Endocrine: Negative for cold intolerance, heat intolerance, polydipsia, polyphagia and polyuria.  Genitourinary: Negative for dysuria, flank pain, frequency, hematuria, menstrual problem, pelvic pain, urgency, vaginal bleeding and vaginal discharge.  Musculoskeletal: Negative for arthralgias, back pain, myalgias and neck pain.  Skin: Negative for rash.  Allergic/Immunologic: Negative for environmental allergies and food allergies.  Neurological: Positive for headaches. Negative for dizziness, weakness, light-headedness and numbness.  Hematological: Negative for adenopathy. Does not bruise/bleed easily.  Psychiatric/Behavioral: Negative for dysphoric mood. The patient is not nervous/anxious.     Patient Active Problem List   Diagnosis Date Noted  . Gastroesophageal reflux disease 09/12/2018  . Primary osteoarthritis involving multiple joints 09/12/2018  . Degenerative lumbar disc 09/12/2018  . Pure hypercholesterolemia 12/23/2017  . Allergic rhinitis 12/17/2014  . Encounter for general adult medical examination without abnormal findings 12/17/2014  . Essential (primary) hypertension 12/17/2014  . Esophagitis, reflux 12/17/2014  . Hypokalemia 12/17/2014  . Abnormal weight gain 12/17/2014  . Dependent edema 12/17/2014    No Known Allergies  Past Surgical History:  Procedure Laterality Date  . CESAREAN SECTION     x 1  . VAGINAL HYSTERECTOMY      Social History   Tobacco Use  . Smoking status: Never Smoker  . Smokeless tobacco: Never Used  Substance Use Topics  . Alcohol use: No    Alcohol/week: 0.0 standard drinks  . Drug use: No     Medication list has been reviewed and updated.  Current Meds  Medication Sig  . aspirin 81 MG tablet Take 81 mg by mouth daily.  Marland Kitchen atenolol (TENORMIN) 100 MG tablet TAKE ONE (1) TABLET  BY MOUTH ONCE DAILY  . cholecalciferol (VITAMIN D) 1000 units tablet Take 1,000 Units by mouth daily.  . hydrochlorothiazide (HYDRODIURIL) 25 MG tablet Take 1 tablet (25 mg total) by mouth daily.  Marland Kitchen ibuprofen (ADVIL,MOTRIN) 600 MG tablet Take 1 tablet (600 mg total) by mouth every 8 (eight) hours as needed.  . meclizine (ANTIVERT) 25 MG tablet Take 1 tablet (25 mg total) by mouth 3 (three) times daily as needed for dizziness.  . pantoprazole (PROTONIX) 40 MG tablet TAKE ONE (1) TABLET BY MOUTH ONCE DAILY  . potassium chloride SA (K-DUR,KLOR-CON) 20 MEQ tablet TAKE ONE (1) TABLET BY MOUTH ONCE DAILY    PHQ 2/9 Scores 05/21/2019 05/15/2019 12/23/2017 10/19/2016  PHQ - 2 Score 0 0 0 0  PHQ- 9 Score 0 0 0 -    BP Readings from Last 3 Encounters:  05/15/19 120/62  09/12/18 120/62  05/05/18 120/62    Physical Exam Nursing note reviewed.  HENT:     Mouth/Throat:     Mouth: Mucous membranes are moist.     Pharynx: No posterior oropharyngeal erythema.  Lymphadenopathy:     Cervical:     Right cervical: No superficial, deep or posterior cervical adenopathy.    Left cervical: No superficial, deep or posterior cervical adenopathy.  Neurological:     Mental Status: She is alert.     Wt Readings from Last 3 Encounters:  05/21/19 213 lb (96.6 kg)  05/15/19 213 lb (96.6 kg)  09/12/18 208 lb (94.3 kg)    Temp 98.9 F (37.2 C) (Oral)   Ht 5\' 2"  (1.575 m)   Wt 213 lb (96.6 kg)   BMI 38.96 kg/m   Assessment and Plan: 1. Acute maxillary sinusitis, recurrence not specified Acute.  Persistent.  Will initiate amoxicillin 500 mg 3 times a day. - amoxicillin (AMOXIL) 500 MG capsule; Take 1 capsule (500 mg total) by mouth 3 (three) times daily.  Dispense: 30 capsule; Refill: 0  2. Cough Symptomatic patient has had difficulty sleeping due to the persistent coughing.  We will initiate Robitussin-AC 1 teaspoon every 6 hours as needed for cough. - guaiFENesin-codeine (ROBITUSSIN AC) 100-10 MG/5ML  syrup; Take 5 mLs by mouth 4 (four) times daily as needed for cough.  Dispense: 118 mL; Refill: 0  I spent 10 minutes with this patient, More than 50% of that time was spent in face to face education, counseling and care coordination.

## 2019-05-21 NOTE — Telephone Encounter (Signed)
Entered in error- sched an televisit

## 2019-06-21 ENCOUNTER — Ambulatory Visit: Payer: Self-pay | Admitting: Family Medicine

## 2019-06-26 ENCOUNTER — Other Ambulatory Visit: Payer: Self-pay

## 2019-06-26 ENCOUNTER — Ambulatory Visit: Payer: BC Managed Care – PPO | Admitting: Family Medicine

## 2019-06-26 ENCOUNTER — Encounter: Payer: Self-pay | Admitting: Family Medicine

## 2019-06-26 VITALS — BP 120/82 | HR 60 | Ht 62.0 in | Wt 209.0 lb

## 2019-06-26 DIAGNOSIS — E876 Hypokalemia: Secondary | ICD-10-CM

## 2019-06-26 DIAGNOSIS — E78019 Familial hypercholesterolemia, unspecified: Secondary | ICD-10-CM | POA: Insufficient documentation

## 2019-06-26 DIAGNOSIS — E7801 Familial hypercholesterolemia: Secondary | ICD-10-CM | POA: Diagnosis not present

## 2019-06-26 DIAGNOSIS — I10 Essential (primary) hypertension: Secondary | ICD-10-CM | POA: Diagnosis not present

## 2019-06-26 DIAGNOSIS — K219 Gastro-esophageal reflux disease without esophagitis: Secondary | ICD-10-CM

## 2019-06-26 DIAGNOSIS — Z6838 Body mass index (BMI) 38.0-38.9, adult: Secondary | ICD-10-CM | POA: Insufficient documentation

## 2019-06-26 DIAGNOSIS — E669 Obesity, unspecified: Secondary | ICD-10-CM

## 2019-06-26 MED ORDER — POTASSIUM CHLORIDE CRYS ER 20 MEQ PO TBCR: EXTENDED_RELEASE_TABLET | ORAL | 5 refills | Status: DC

## 2019-06-26 MED ORDER — PANTOPRAZOLE SODIUM 40 MG PO TBEC: DELAYED_RELEASE_TABLET | ORAL | 5 refills | Status: DC

## 2019-06-26 MED ORDER — ATENOLOL 100 MG PO TABS: ORAL_TABLET | ORAL | 5 refills | Status: DC

## 2019-06-26 MED ORDER — HYDROCHLOROTHIAZIDE 25 MG PO TABS: 25.0000 mg | ORAL_TABLET | Freq: Every day | ORAL | 5 refills | Status: DC

## 2019-06-26 NOTE — Progress Notes (Signed)
Date:  06/26/2019   Name:  Toni Arroyo   DOB:  11-01-1955   MRN:  [id number]   Chief Complaint: Gastroesophageal Reflux, Hypertension, and hypokalemia  Gastroesophageal Reflux She reports no abdominal pain, no belching, no chest pain, no choking, no coughing, no dysphagia, no early satiety, no globus sensation, no heartburn, no hoarse voice, no nausea, no sore throat, no stridor, no tooth decay, no water brash or no wheezing. This is a chronic problem. The current episode started more than 1 year ago. The problem has been gradually improving. The symptoms are aggravated by certain foods. Pertinent negatives include no anemia, fatigue, melena, muscle weakness, orthopnea or weight loss. She has tried a PPI for the symptoms. The treatment provided moderate relief.  Hypertension This is a chronic problem. The current episode started more than 1 year ago. The problem has been gradually improving since onset. The problem is controlled. Pertinent negatives include no anxiety, blurred vision, chest pain, headaches, malaise/fatigue, neck pain, orthopnea, palpitations, peripheral edema, PND, shortness of breath or sweats. There are no associated agents to hypertension. Past treatments include beta blockers and diuretics. The current treatment provides moderate improvement. There is no history of chronic renal disease, a hypertension causing med or renovascular disease.  Hyperlipidemia This is a chronic problem. The current episode started more than 1 year ago. The problem is controlled. Recent lipid tests were reviewed and are normal. She has no history of chronic renal disease, diabetes, hypothyroidism, liver disease, obesity or nephrotic syndrome. Factors aggravating her hyperlipidemia include thiazides. Pertinent negatives include no chest pain, focal sensory loss, focal weakness, myalgias or shortness of breath. The current treatment provides moderate improvement of lipids.    Lab Results  Component  Value Date   CREATININE 0.72 09/12/2018   BUN 8 09/12/2018   NA 148 (H) 09/12/2018   K 3.8 09/12/2018   CL 103 09/12/2018   CO2 29 09/12/2018   Lab Results  Component Value Date   CHOL 187 09/12/2018   HDL 54 09/12/2018   LDLCALC 115 (H) 09/12/2018   TRIG 89 09/12/2018   CHOLHDL 2.9 12/23/2017   No results found for: TSH No results found for: HGBA1C   Review of Systems  Constitutional: Negative.  Negative for chills, fatigue, fever, malaise/fatigue, unexpected weight change and weight loss.  HENT: Negative for congestion, ear discharge, ear pain, hoarse voice, rhinorrhea, sinus pressure, sneezing and sore throat.   Eyes: Negative for blurred vision, photophobia, pain, discharge, redness and itching.  Respiratory: Negative for cough, choking, shortness of breath, wheezing and stridor.   Cardiovascular: Negative for chest pain, palpitations, orthopnea and PND.  Gastrointestinal: Negative for abdominal pain, blood in stool, constipation, diarrhea, dysphagia, heartburn, melena, nausea and vomiting.  Endocrine: Negative for cold intolerance, heat intolerance, polydipsia, polyphagia and polyuria.  Genitourinary: Negative for dysuria, flank pain, frequency, hematuria, menstrual problem, pelvic pain, urgency, vaginal bleeding and vaginal discharge.  Musculoskeletal: Negative for arthralgias, back pain, myalgias, muscle weakness and neck pain.  Skin: Negative for rash.  Allergic/Immunologic: Negative for environmental allergies and food allergies.  Neurological: Negative for dizziness, focal weakness, weakness, light-headedness, numbness and headaches.  Hematological: Negative for adenopathy. Does not bruise/bleed easily.  Psychiatric/Behavioral: Negative for dysphoric mood. The patient is not nervous/anxious.     Patient Active Problem List   Diagnosis Date Noted  . Gastroesophageal reflux disease 09/12/2018  . Primary osteoarthritis involving multiple joints 09/12/2018  .  Degenerative lumbar disc 09/12/2018  . Pure hypercholesterolemia 12/23/2017  .  Allergic rhinitis 12/17/2014  . Encounter for general adult medical examination without abnormal findings 12/17/2014  . Essential (primary) hypertension 12/17/2014  . Esophagitis, reflux 12/17/2014  . Hypokalemia 12/17/2014  . Abnormal weight gain 12/17/2014  . Dependent edema 12/17/2014    No Known Allergies  Past Surgical History:  Procedure Laterality Date  . CESAREAN SECTION     x 1  . VAGINAL HYSTERECTOMY      Social History   Tobacco Use  . Smoking status: Never Smoker  . Smokeless tobacco: Never Used  Substance Use Topics  . Alcohol use: No    Alcohol/week: 0.0 standard drinks  . Drug use: No     Medication list has been reviewed and updated.  Current Meds  Medication Sig  . aspirin 81 MG tablet Take 81 mg by mouth daily.  Marland Kitchen atenolol (TENORMIN) 100 MG tablet TAKE ONE (1) TABLET BY MOUTH ONCE DAILY  . cholecalciferol (VITAMIN D) 1000 units tablet Take 1,000 Units by mouth daily.  . hydrochlorothiazide (HYDRODIURIL) 25 MG tablet Take 1 tablet (25 mg total) by mouth daily.  Marland Kitchen ibuprofen (ADVIL,MOTRIN) 600 MG tablet Take 1 tablet (600 mg total) by mouth every 8 (eight) hours as needed.  . meclizine (ANTIVERT) 25 MG tablet Take 1 tablet (25 mg total) by mouth 3 (three) times daily as needed for dizziness.  . pantoprazole (PROTONIX) 40 MG tablet TAKE ONE (1) TABLET BY MOUTH ONCE DAILY  . potassium chloride SA (K-DUR,KLOR-CON) 20 MEQ tablet TAKE ONE (1) TABLET BY MOUTH ONCE DAILY    PHQ 2/9 Scores 06/26/2019 05/21/2019 05/15/2019 12/23/2017  PHQ - 2 Score 0 0 0 0  PHQ- 9 Score 0 0 0 0    BP Readings from Last 3 Encounters:  06/26/19 120/82  05/15/19 120/62  09/12/18 120/62    Physical Exam Vitals and nursing note reviewed.  Constitutional:      General: She is not in acute distress.    Appearance: She is not diaphoretic.  HENT:     Head: Normocephalic and atraumatic.     Right  Ear: External ear normal.     Left Ear: External ear normal.     Nose: Nose normal.  Eyes:     General:        Right eye: No discharge.        Left eye: No discharge.     Conjunctiva/sclera: Conjunctivae normal.     Pupils: Pupils are equal, round, and reactive to light.  Neck:     Thyroid: No thyromegaly.     Vascular: No JVD.  Cardiovascular:     Rate and Rhythm: Normal rate and regular rhythm.     Chest Wall: PMI is not displaced.     Heart sounds: Normal heart sounds, S1 normal and S2 normal. No murmur. No systolic murmur. No diastolic murmur. No friction rub. No gallop. No S3 or S4 sounds.   Pulmonary:     Effort: Pulmonary effort is normal.     Breath sounds: Normal breath sounds. No wheezing or rhonchi.  Abdominal:     General: Bowel sounds are normal.     Palpations: Abdomen is soft. There is no mass.     Tenderness: There is no abdominal tenderness. There is no guarding.  Musculoskeletal:        General: Normal range of motion.     Cervical back: Normal range of motion and neck supple.     Right lower leg: No edema.     Left  lower leg: No edema.  Lymphadenopathy:     Cervical: No cervical adenopathy.  Skin:    General: Skin is warm and dry.  Neurological:     Mental Status: She is alert.     Deep Tendon Reflexes: Reflexes are normal and symmetric.     Wt Readings from Last 3 Encounters:  06/26/19 209 lb (94.8 kg)  05/21/19 213 lb (96.6 kg)  05/15/19 213 lb (96.6 kg)    BP 120/82   Pulse 60   Ht 5\' 2"  (1.575 m)   Wt 209 lb (94.8 kg)   BMI 38.23 kg/m   Assessment and Plan: 1. Hypokalemia Chronic.  Iatrogenic.  Stable.  Will reassess with comprehensive metabolic panel and with the electrolyte recheck in the meantime we will continue potassium chloride sustained release 20 mEq once a day. - Comprehensive Metabolic Panel (CMET) - potassium chloride SA (KLOR-CON) 20 MEQ tablet; TAKE ONE (1) TABLET BY MOUTH ONCE DAILY  Dispense: 30 tablet; Refill: 5  2.  Gastroesophageal reflux disease Chronic.  Controlled.  Stable.  Patient is very pleased with the control of her heartburn with Protonix and will definitely continue 40 mg once a day. - pantoprazole (PROTONIX) 40 MG tablet; TAKE ONE (1) TABLET BY MOUTH ONCE DAILY  Dispense: 30 tablet; Refill: 5  3. Essential (primary) hypertension Chronic.  Controlled.  Stable.  Continue hydrochlorothiazide 25 mg and atenolol 100 mg once a day.  Will recheck in 6 months.  We will recheck CMP for electrolytes and GFR. - Comprehensive Metabolic Panel (CMET) - hydrochlorothiazide (HYDRODIURIL) 25 MG tablet; Take 1 tablet (25 mg total) by mouth daily.  Dispense: 30 tablet; Refill: 5 - atenolol (TENORMIN) 100 MG tablet; TAKE ONE (1) TABLET BY MOUTH ONCE DAILY  Dispense: 30 tablet; Refill: 5  4. Familial hypercholesterolemia Patient currently not on statin.  This is chronic.  Uncontrolled.  We will recheck lipid panel and patient is aware that we may want to start a statin if LDL is a little elevated. - Lipid Panel With LDL/HDL Ratio  5. Class 2 obesity with body mass index (BMI) of 38.0 to 38.9 in adult, unspecified obesity type, unspecified whether serious comorbidity present Health risks of being over weight were discussed and patient was counseled on weight loss options and exercise.

## 2019-06-26 NOTE — Patient Instructions (Signed)

## 2019-06-27 LAB — LIPID PANEL WITH LDL/HDL RATIO
Cholesterol, Total: 175 mg/dL (ref 100–199)
HDL: 70 mg/dL (ref >39)
LDL Chol Calc (NIH): 93 mg/dL (ref 0–99)
LDL/HDL Ratio: 1.3 ratio (ref 0.0–3.2)
Triglycerides: 65 mg/dL (ref 0–149)
VLDL Cholesterol Cal: 12 mg/dL (ref 5–40)

## 2019-06-27 LAB — COMPREHENSIVE METABOLIC PANEL
ALT: 11 IU/L (ref 0–32)
AST: 19 IU/L (ref 0–40)
Albumin/Globulin Ratio: 1.3 (ref 1.2–2.2)
Albumin: 4 g/dL (ref 3.8–4.8)
Alkaline Phosphatase: 70 IU/L (ref 39–117)
BUN/Creatinine Ratio: 15 (ref 12–28)
BUN: 9 mg/dL (ref 8–27)
Bilirubin Total: 0.4 mg/dL (ref 0.0–1.2)
CO2: 28 mmol/L (ref 20–29)
Calcium: 9.6 mg/dL (ref 8.7–10.3)
Chloride: 100 mmol/L (ref 96–106)
Creatinine, Ser: 0.62 mg/dL (ref 0.57–1.00)
GFR calc Af Amer: 112 mL/min/{1.73_m2} (ref >59)
GFR calc non Af Amer: 97 mL/min/{1.73_m2} (ref >59)
Globulin, Total: 3 g/dL (ref 1.5–4.5)
Glucose: 74 mg/dL (ref 65–99)
Potassium: 4 mmol/L (ref 3.5–5.2)
Sodium: 143 mmol/L (ref 134–144)
Total Protein: 7 g/dL (ref 6.0–8.5)

## 2019-07-17 ENCOUNTER — Encounter: Payer: Self-pay | Admitting: Family Medicine

## 2019-07-17 ENCOUNTER — Other Ambulatory Visit: Payer: Self-pay

## 2019-07-17 ENCOUNTER — Ambulatory Visit (INDEPENDENT_AMBULATORY_CARE_PROVIDER_SITE_OTHER): Payer: BC Managed Care – PPO | Admitting: Family Medicine

## 2019-07-17 VITALS — BP 124/64 | HR 64 | Ht 62.0 in | Wt 209.0 lb

## 2019-07-17 DIAGNOSIS — N3090 Cystitis, unspecified without hematuria: Secondary | ICD-10-CM | POA: Diagnosis not present

## 2019-07-17 LAB — POCT URINALYSIS DIPSTICK
Bilirubin, UA: NEGATIVE
Blood, UA: NEGATIVE
Glucose, UA: NEGATIVE
Ketones, UA: NEGATIVE
Leukocytes, UA: NEGATIVE
Nitrite, UA: POSITIVE
Protein, UA: NEGATIVE
Spec Grav, UA: 1.015 (ref 1.010–1.025)
Urobilinogen, UA: 0.2 E.U./dL
pH, UA: 6.5 (ref 5.0–8.0)

## 2019-07-17 MED ORDER — CEPHALEXIN 500 MG PO CAPS: 500.0000 mg | ORAL_CAPSULE | Freq: Four times a day (QID) | ORAL | 0 refills | Status: DC

## 2019-07-17 NOTE — Progress Notes (Signed)
Date:  07/17/2019   Name:  Toni Arroyo   DOB:  09-Dec-1955   MRN:  [id number]   Chief Complaint: Urinary Tract Infection (frequent urination, but not able to pee alot at one time)  Urinary Tract Infection  This is a new problem. The current episode started in the past 7 days. The problem occurs every urination. The problem has been gradually worsening. The quality of the pain is described as burning. The pain is mild. There has been no fever. Associated symptoms include frequency and urgency. Pertinent negatives include no chills, discharge, flank pain, hematuria, hesitancy, nausea, sweats or vomiting. She has tried increased fluids for the symptoms. The treatment provided mild relief.    Lab Results  Component Value Date   CREATININE 0.62 06/26/2019   BUN 9 06/26/2019   NA 143 06/26/2019   K 4.0 06/26/2019   CL 100 06/26/2019   CO2 28 06/26/2019   Lab Results  Component Value Date   CHOL 175 06/26/2019   HDL 70 06/26/2019   LDLCALC 93 06/26/2019   TRIG 65 06/26/2019   CHOLHDL 2.9 12/23/2017   No results found for: TSH No results found for: HGBA1C   Review of Systems  Constitutional: Negative.  Negative for chills, fatigue, fever and unexpected weight change.  HENT: Negative for congestion, ear discharge, ear pain, rhinorrhea, sinus pressure, sneezing and sore throat.   Eyes: Negative for photophobia, pain, discharge, redness and itching.  Respiratory: Negative for cough, shortness of breath, wheezing and stridor.   Cardiovascular: Negative for chest pain, palpitations and leg swelling.  Gastrointestinal: Negative for abdominal pain, blood in stool, constipation, diarrhea, nausea and vomiting.  Endocrine: Negative for cold intolerance, heat intolerance, polydipsia, polyphagia and polyuria.  Genitourinary: Positive for frequency and urgency. Negative for dysuria, flank pain, hematuria, hesitancy, menstrual problem, pelvic pain, vaginal bleeding and vaginal discharge.   Musculoskeletal: Negative for arthralgias, back pain and myalgias.  Skin: Negative for rash.  Allergic/Immunologic: Negative for environmental allergies and food allergies.  Neurological: Negative for dizziness, weakness, light-headedness, numbness and headaches.  Hematological: Negative for adenopathy. Does not bruise/bleed easily.  Psychiatric/Behavioral: Negative for dysphoric mood. The patient is not nervous/anxious.     Patient Active Problem List   Diagnosis Date Noted  . Familial hypercholesterolemia 06/26/2019  . Class 2 obesity with body mass index (BMI) of 38.0 to 38.9 in adult 06/26/2019  . Gastroesophageal reflux disease 09/12/2018  . Primary osteoarthritis involving multiple joints 09/12/2018  . Degenerative lumbar disc 09/12/2018  . Pure hypercholesterolemia 12/23/2017  . Allergic rhinitis 12/17/2014  . Encounter for general adult medical examination without abnormal findings 12/17/2014  . Essential (primary) hypertension 12/17/2014  . Esophagitis, reflux 12/17/2014  . Hypokalemia 12/17/2014  . Abnormal weight gain 12/17/2014  . Dependent edema 12/17/2014    No Known Allergies  Past Surgical History:  Procedure Laterality Date  . CESAREAN SECTION     x 1  . VAGINAL HYSTERECTOMY      Social History   Tobacco Use  . Smoking status: Never Smoker  . Smokeless tobacco: Never Used  Substance Use Topics  . Alcohol use: No    Alcohol/week: 0.0 standard drinks  . Drug use: No     Medication list has been reviewed and updated.  Current Meds  Medication Sig  . aspirin 81 MG tablet Take 81 mg by mouth daily.  Marland Kitchen atenolol (TENORMIN) 100 MG tablet TAKE ONE (1) TABLET BY MOUTH ONCE DAILY  . cholecalciferol (VITAMIN D) 1000  units tablet Take 1,000 Units by mouth daily.  . hydrochlorothiazide (HYDRODIURIL) 25 MG tablet Take 1 tablet (25 mg total) by mouth daily.  Marland Kitchen ibuprofen (ADVIL,MOTRIN) 600 MG tablet Take 1 tablet (600 mg total) by mouth every 8 (eight) hours as  needed.  . meclizine (ANTIVERT) 25 MG tablet Take 1 tablet (25 mg total) by mouth 3 (three) times daily as needed for dizziness.  . pantoprazole (PROTONIX) 40 MG tablet TAKE ONE (1) TABLET BY MOUTH ONCE DAILY  . potassium chloride SA (KLOR-CON) 20 MEQ tablet TAKE ONE (1) TABLET BY MOUTH ONCE DAILY    PHQ 2/9 Scores 06/26/2019 05/21/2019 05/15/2019 12/23/2017  PHQ - 2 Score 0 0 0 0  PHQ- 9 Score 0 0 0 0    BP Readings from Last 3 Encounters:  07/17/19 124/64  06/26/19 120/82  05/15/19 120/62    Physical Exam Vitals and nursing note reviewed.  Constitutional:      General: She is not in acute distress.    Appearance: She is not diaphoretic.  HENT:     Head: Normocephalic and atraumatic.     Right Ear: External ear normal.     Left Ear: External ear normal.     Nose: Nose normal.  Eyes:     General:        Right eye: No discharge.        Left eye: No discharge.     Conjunctiva/sclera: Conjunctivae normal.     Pupils: Pupils are equal, round, and reactive to light.  Neck:     Thyroid: No thyromegaly.     Vascular: No JVD.  Cardiovascular:     Rate and Rhythm: Normal rate and regular rhythm.     Heart sounds: Normal heart sounds. No murmur. No friction rub. No gallop.   Pulmonary:     Effort: Pulmonary effort is normal.     Breath sounds: Normal breath sounds. No wheezing or rhonchi.  Abdominal:     General: Bowel sounds are normal.     Palpations: Abdomen is soft. There is no mass.     Tenderness: There is abdominal tenderness in the suprapubic area. There is no right CVA tenderness, left CVA tenderness or guarding.  Musculoskeletal:        General: Normal range of motion.     Cervical back: Normal range of motion and neck supple.  Lymphadenopathy:     Cervical: No cervical adenopathy.  Skin:    General: Skin is warm and dry.  Neurological:     Mental Status: She is alert.     Deep Tendon Reflexes: Reflexes are normal and symmetric.     Wt Readings from Last 3  Encounters:  07/17/19 209 lb (94.8 kg)  06/26/19 209 lb (94.8 kg)  05/21/19 213 lb (96.6 kg)    BP 124/64   Pulse 64   Ht 5\' 2"  (1.575 m)   Wt 209 lb (94.8 kg)   BMI 38.23 kg/m   Assessment and Plan: 1. Cystitis New onset.  Persistent.  Patient has symptoms of frequency and urgency.  Urinalysis notes that there is nitrites as well as an odor.  Patient's exam is consistent with cystitis with tenderness of the suprapubic area.  We will initiate cephalexin 500 mg 4 times a day for 3 days.  Patient is to return if symptoms should persist - cephALEXin (KEFLEX) 500 MG capsule; Take 1 capsule (500 mg total) by mouth 4 (four) times daily.  Dispense: 12 capsule; Refill: 0 -  POCT urinalysis dipstick

## 2019-11-29 ENCOUNTER — Other Ambulatory Visit: Payer: Self-pay

## 2019-11-29 ENCOUNTER — Encounter: Payer: Self-pay | Admitting: Family Medicine

## 2019-11-29 ENCOUNTER — Ambulatory Visit (INDEPENDENT_AMBULATORY_CARE_PROVIDER_SITE_OTHER): Payer: BC Managed Care – PPO | Admitting: Family Medicine

## 2019-11-29 VITALS — BP 130/70 | HR 72 | Ht 62.0 in | Wt 207.0 lb

## 2019-11-29 DIAGNOSIS — M7061 Trochanteric bursitis, right hip: Secondary | ICD-10-CM

## 2019-11-29 DIAGNOSIS — Z1211 Encounter for screening for malignant neoplasm of colon: Secondary | ICD-10-CM | POA: Diagnosis not present

## 2019-11-29 DIAGNOSIS — M7631 Iliotibial band syndrome, right leg: Secondary | ICD-10-CM

## 2019-11-29 MED ORDER — MELOXICAM 15 MG PO TABS: 15.0000 mg | ORAL_TABLET | Freq: Every day | ORAL | 0 refills | Status: DC

## 2019-11-29 MED ORDER — PREDNISONE 10 MG PO TABS: ORAL_TABLET | ORAL | 1 refills | Status: AC

## 2019-11-29 MED ORDER — CYCLOBENZAPRINE HCL 5 MG PO TABS: 5.0000 mg | ORAL_TABLET | Freq: Every day | ORAL | 1 refills | Status: DC

## 2019-11-29 NOTE — Progress Notes (Signed)
Date:  11/29/2019   Name:  Toni Arroyo   DOB:  June 15, 1955   MRN:  [id number]   Chief Complaint: Leg Pain (R) leg pain- hurts at night when trying to sleep/ has to change position and can't sleep on that side) and Colon Cancer Screening (will do FIT test)  Leg Pain  The incident occurred more than 1 week ago. There was no injury mechanism. The pain is present in the right thigh, right knee, right ankle and right foot. The pain is moderate. The pain has been fluctuating since onset. Associated symptoms include muscle weakness. Pertinent negatives include no inability to bear weight, loss of motion, loss of sensation, numbness or tingling. Exacerbated by: positioning at night. She has tried NSAIDs for the symptoms. The treatment provided moderate relief.    Lab Results  Component Value Date   CREATININE 0.62 06/26/2019   BUN 9 06/26/2019   NA 143 06/26/2019   K 4.0 06/26/2019   CL 100 06/26/2019   CO2 28 06/26/2019   Lab Results  Component Value Date   CHOL 175 06/26/2019   HDL 70 06/26/2019   LDLCALC 93 06/26/2019   TRIG 65 06/26/2019   CHOLHDL 2.9 12/23/2017   No results found for: TSH No results found for: HGBA1C No results found for: WBC, HGB, HCT, MCV, PLT Lab Results  Component Value Date   ALT 11 06/26/2019   AST 19 06/26/2019   ALKPHOS 70 06/26/2019   BILITOT 0.4 06/26/2019     Review of Systems  Constitutional: Negative.  Negative for chills, fatigue, fever and unexpected weight change.  HENT: Negative for congestion, ear discharge, ear pain, rhinorrhea, sinus pressure, sneezing and sore throat.   Eyes: Negative for photophobia, pain, discharge, redness and itching.  Respiratory: Negative for cough, shortness of breath, wheezing and stridor.   Gastrointestinal: Negative for abdominal pain, blood in stool, constipation, diarrhea, nausea and vomiting.  Endocrine: Negative for cold intolerance, heat intolerance, polydipsia, polyphagia and polyuria.   Genitourinary: Negative for dysuria, flank pain, frequency, hematuria, menstrual problem, pelvic pain, urgency, vaginal bleeding and vaginal discharge.  Musculoskeletal: Negative for arthralgias, back pain and myalgias.  Skin: Negative for rash.  Allergic/Immunologic: Negative for environmental allergies and food allergies.  Neurological: Negative for dizziness, tingling, weakness, light-headedness, numbness and headaches.  Hematological: Negative for adenopathy. Does not bruise/bleed easily.  Psychiatric/Behavioral: Negative for dysphoric mood. The patient is not nervous/anxious.     Patient Active Problem List   Diagnosis Date Noted  . Familial hypercholesterolemia 06/26/2019  . Class 2 obesity with body mass index (BMI) of 38.0 to 38.9 in adult 06/26/2019  . Gastroesophageal reflux disease 09/12/2018  . Primary osteoarthritis involving multiple joints 09/12/2018  . Degenerative lumbar disc 09/12/2018  . Pure hypercholesterolemia 12/23/2017  . Allergic rhinitis 12/17/2014  . Encounter for general adult medical examination without abnormal findings 12/17/2014  . Essential (primary) hypertension 12/17/2014  . Esophagitis, reflux 12/17/2014  . Hypokalemia 12/17/2014  . Abnormal weight gain 12/17/2014  . Dependent edema 12/17/2014    No Known Allergies  Past Surgical History:  Procedure Laterality Date  . CESAREAN SECTION     x 1  . VAGINAL HYSTERECTOMY      Social History   Tobacco Use  . Smoking status: Never Smoker  . Smokeless tobacco: Never Used  Substance Use Topics  . Alcohol use: No    Alcohol/week: 0.0 standard drinks  . Drug use: No     Medication list has been reviewed  and updated.  Current Meds  Medication Sig  . aspirin 81 MG tablet Take 81 mg by mouth daily.  Marland Kitchen atenolol (TENORMIN) 100 MG tablet TAKE ONE (1) TABLET BY MOUTH ONCE DAILY  . cholecalciferol (VITAMIN D) 1000 units tablet Take 1,000 Units by mouth daily.  . hydrochlorothiazide  (HYDRODIURIL) 25 MG tablet Take 1 tablet (25 mg total) by mouth daily.  Marland Kitchen ibuprofen (ADVIL,MOTRIN) 600 MG tablet Take 1 tablet (600 mg total) by mouth every 8 (eight) hours as needed.  . meclizine (ANTIVERT) 25 MG tablet Take 1 tablet (25 mg total) by mouth 3 (three) times daily as needed for dizziness.  . pantoprazole (PROTONIX) 40 MG tablet TAKE ONE (1) TABLET BY MOUTH ONCE DAILY  . potassium chloride SA (KLOR-CON) 20 MEQ tablet TAKE ONE (1) TABLET BY MOUTH ONCE DAILY    PHQ 2/9 Scores 11/29/2019 06/26/2019 05/21/2019 05/15/2019  PHQ - 2 Score 0 0 0 0  PHQ- 9 Score 0 0 0 0    GAD 7 : Generalized Anxiety Score 11/29/2019 06/26/2019  Nervous, Anxious, on Edge 0 0  Control/stop worrying 0 0  Worry too much - different things 0 0  Trouble relaxing 0 0  Restless 0 0  Easily annoyed or irritable 0 0  Afraid - awful might happen 0 0  Total GAD 7 Score 0 0    BP Readings from Last 3 Encounters:  11/29/19 130/70  07/17/19 124/64  06/26/19 120/82    Physical Exam Vitals and nursing note reviewed.  Constitutional:      Appearance: She is well-developed.  HENT:     Head: Normocephalic.     Right Ear: Tympanic membrane and external ear normal.     Left Ear: Tympanic membrane and external ear normal.  Eyes:     General: Lids are everted, no foreign bodies appreciated. No scleral icterus.       Left eye: No foreign body or hordeolum.     Conjunctiva/sclera: Conjunctivae normal.     Right eye: Right conjunctiva is not injected.     Left eye: Left conjunctiva is not injected.     Pupils: Pupils are equal, round, and reactive to light.  Neck:     Thyroid: No thyromegaly.     Vascular: No JVD.     Trachea: No tracheal deviation.  Cardiovascular:     Rate and Rhythm: Normal rate and regular rhythm.     Heart sounds: Normal heart sounds. No murmur heard.  No friction rub. No gallop.   Pulmonary:     Effort: Pulmonary effort is normal. No respiratory distress.     Breath sounds: Normal  breath sounds. No wheezing or rales.  Abdominal:     General: Bowel sounds are normal.     Palpations: Abdomen is soft. There is no mass.     Tenderness: There is no abdominal tenderness. There is no guarding or rebound.  Musculoskeletal:        General: Normal range of motion.     Cervical back: Normal range of motion and neck supple.     Right upper leg: Tenderness present.     Right knee: Tenderness present.     Right lower leg: Tenderness present. No swelling, deformity or lacerations. No edema.       Legs:  Lymphadenopathy:     Cervical: No cervical adenopathy.  Skin:    General: Skin is warm.     Findings: No rash.  Neurological:     Mental Status:  She is alert and oriented to person, place, and time.     Cranial Nerves: No cranial nerve deficit.     Deep Tendon Reflexes: Reflexes normal.  Psychiatric:        Mood and Affect: Mood is not anxious or depressed.     Wt Readings from Last 3 Encounters:  11/29/19 207 lb (93.9 kg)  07/17/19 209 lb (94.8 kg)  06/26/19 209 lb (94.8 kg)    BP 130/70   Pulse 72   Ht 5\' 2"  (1.575 m)   Wt 207 lb (93.9 kg)   BMI 37.86 kg/m   Assessment and Plan: 1. Iliotibial band syndrome of right side New onset.  Persistent.  Pain along the lateral aspect of the right leg that is intermittent.  Patient walks on the road in the evening time after dinner and then when she goes to bed at night she has significant pain along the lateral aspect of the leg although patient has had pain in the lower back the lateral leg tenderness is noted to be present today on exam.  This could be consistent with iliotibial band with trochanteric or myositis.  Will initiate meloxicam 15 mg once a day, prednisone taper starting at 60 mg, and cyclobenzaprine 5 mg to take at night.  I rather doubt this is radiculopathy since there is exquisite tenderness along the lateral aspect consistent with iliotibial band.  Patient has been instructed if there is no improvement on  Monday to give [place] a call and her next step will be an orthopedic referral. - meloxicam (MOBIC) 15 MG tablet; Take 1 tablet (15 mg total) by mouth daily.  Dispense: 30 tablet; Refill: 0 - predniSONE (DELTASONE) 10 MG tablet; Taper 6,6,6,5,5,5,4,4,3,3,2,2,1,1  Dispense: 53 tablet; Refill: 1 - cyclobenzaprine (FLEXERIL) 5 MG tablet; Take 1 tablet (5 mg total) by mouth at bedtime.  Dispense: 30 tablet; Refill: 1  2. Trochanteric bursitis, right hip As noted above. - meloxicam (MOBIC) 15 MG tablet; Take 1 tablet (15 mg total) by mouth daily.  Dispense: 30 tablet; Refill: 0 - predniSONE (DELTASONE) 10 MG tablet; Taper 6,6,6,5,5,5,4,4,3,3,2,2,1,1  Dispense: 53 tablet; Refill: 1 - cyclobenzaprine (FLEXERIL) 5 MG tablet; Take 1 tablet (5 mg total) by mouth at bedtime.  Dispense: 30 tablet; Refill: 1  3. Colon cancer screening This was discussed with patient and patient prefers not to do colonoscopy but is willing to do fecal immunoassay assay. - Fecal occult blood, imunochemical

## 2019-11-29 NOTE — Patient Instructions (Signed)
Iliotibial Band Syndrome  Iliotibial band syndrome (ITBS) is a condition that often causes knee pain. It can also cause pain in the outside of your hip, thigh, and knee. The iliotibial band is a strip of tissue that runs from the outside of your hip and down your thigh to the outside of your knee. Repeatedly bending and straightening your knee can irritate the iliotibial band. What are the causes? This condition is caused by inflammation and irritation from the friction of the iliotibial band moving over the thigh bone (femur) when you repeatedly bend and straighten your knee. What increases the risk? This condition is more likely to develop in people who:  Frequently change elevation during their workouts.  Run very long distances.  Recently increased the length or intensity of their workouts.  Run downhill often, or just started running downhill.  Ride a bike very far or often. You may also be at greater risk if you start a new workout routine without first warming up or if you have a job that requires you to bend, squat, or climb frequently. What are the signs or symptoms? Symptoms of this condition include:  Pain along the outside of your knee that may be worse with activity, especially running or going up and down stairs.  A "snapping" sensation over your knee.  Swelling on the outside of your knee.  Pain or a feeling of tightness in your hip. How is this diagnosed? This condition is diagnosed based on your symptoms, medical history, and physical exam. You may also see a health care provider who specializes in reducing pain and increasing mobility (physical therapist). A physical therapist may do an exam to check your balance, movement, and way of walking or running (gait) to see whether the way you move could contribute to your injury. You may also have tests to measure your strength, flexibility, and range of motion. How is this treated? Treatment for this condition  includes:  Resting and limiting exercise.  Returning to activities gradually.  Doing range-of-motion and strengthening exercises (physical therapy) as told by your health care provider.  Including low-impact activities, such as swimming, in your exercise routine. Follow these instructions at home:  If directed, apply ice to the injured area. ? Put ice in a plastic bag. ? Place a towel between your skin and the bag. ? Leave the ice on for 20 minutes, 2-3 times per day.  Return to your normal activities as told by your health care provider. Ask your health care provider what activities are safe for you.  Keep all follow-up visits with your health care provider. This is important. Contact a health care provider if:  Your pain does not improve or gets worse despite treatment. This information is not intended to replace advice given to you by your health care provider. Make sure you discuss any questions you have with your health care provider. Document Revised: 05/13/2017 Document Reviewed: 07/02/2016 Elsevier Patient Education  2020 [employer]  

## 2019-12-03 ENCOUNTER — Telehealth: Payer: Self-pay | Admitting: Family Medicine

## 2019-12-03 NOTE — Telephone Encounter (Unsigned)
Copied from CRM [id number]. Topic: General - Call Back - No Documentation >> Dec 03, 2019 11:12 AM Randol Kern wrote: Pt would like a call back from Nurse Delice Bison. Please advise  Best contact: [phone]

## 2019-12-03 NOTE — Telephone Encounter (Signed)
Pt just called to let Dr Yetta Barre her legs are not hurting as bad as they were and she will continue meloxicam

## 2019-12-21 ENCOUNTER — Other Ambulatory Visit: Payer: Self-pay | Admitting: Family Medicine

## 2019-12-21 DIAGNOSIS — I10 Essential (primary) hypertension: Secondary | ICD-10-CM

## 2019-12-23 LAB — FECAL OCCULT BLOOD, IMMUNOCHEMICAL: Fecal Occult Bld: NEGATIVE

## 2019-12-27 ENCOUNTER — Other Ambulatory Visit: Payer: Self-pay

## 2019-12-27 ENCOUNTER — Encounter: Payer: Self-pay | Admitting: Family Medicine

## 2019-12-27 ENCOUNTER — Ambulatory Visit (INDEPENDENT_AMBULATORY_CARE_PROVIDER_SITE_OTHER): Payer: BC Managed Care – PPO | Admitting: Family Medicine

## 2019-12-27 DIAGNOSIS — E876 Hypokalemia: Secondary | ICD-10-CM

## 2019-12-27 DIAGNOSIS — M159 Polyosteoarthritis, unspecified: Secondary | ICD-10-CM

## 2019-12-27 DIAGNOSIS — K219 Gastro-esophageal reflux disease without esophagitis: Secondary | ICD-10-CM | POA: Diagnosis not present

## 2019-12-27 DIAGNOSIS — M5136 Other intervertebral disc degeneration, lumbar region: Secondary | ICD-10-CM

## 2019-12-27 DIAGNOSIS — M7631 Iliotibial band syndrome, right leg: Secondary | ICD-10-CM | POA: Diagnosis not present

## 2019-12-27 DIAGNOSIS — I10 Essential (primary) hypertension: Secondary | ICD-10-CM | POA: Diagnosis not present

## 2019-12-27 DIAGNOSIS — M7061 Trochanteric bursitis, right hip: Secondary | ICD-10-CM

## 2019-12-27 DIAGNOSIS — M8949 Other hypertrophic osteoarthropathy, multiple sites: Secondary | ICD-10-CM

## 2019-12-27 MED ORDER — CYCLOBENZAPRINE HCL 5 MG PO TABS: 5.0000 mg | ORAL_TABLET | Freq: Every day | ORAL | 1 refills | Status: AC

## 2019-12-27 MED ORDER — POTASSIUM CHLORIDE CRYS ER 20 MEQ PO TBCR: EXTENDED_RELEASE_TABLET | ORAL | 1 refills | Status: AC

## 2019-12-27 MED ORDER — PANTOPRAZOLE SODIUM 40 MG PO TBEC: DELAYED_RELEASE_TABLET | ORAL | 1 refills | Status: AC

## 2019-12-27 MED ORDER — HYDROCHLOROTHIAZIDE 25 MG PO TABS: ORAL_TABLET | ORAL | 1 refills | Status: DC

## 2019-12-27 MED ORDER — MELOXICAM 15 MG PO TABS: 15.0000 mg | ORAL_TABLET | Freq: Every day | ORAL | 1 refills | Status: AC

## 2019-12-27 MED ORDER — IBUPROFEN 600 MG PO TABS: 600.0000 mg | ORAL_TABLET | Freq: Three times a day (TID) | ORAL | 1 refills | Status: AC | PRN

## 2019-12-27 MED ORDER — ATENOLOL 100 MG PO TABS: ORAL_TABLET | ORAL | 1 refills | Status: DC

## 2019-12-27 NOTE — Progress Notes (Signed)
Date:  12/27/2019   Name:  Toni Arroyo   DOB:  10-24-1955   MRN:  [id number]   Chief Complaint: hypokalemia (follow up ), Gastroesophageal Reflux (f/u), Hypertension (f/u), and Hyperlipidemia (f/u)  Gastroesophageal Reflux She reports no abdominal pain, no belching, no chest pain, no choking, no coughing, no dysphagia, no early satiety, no globus sensation, no heartburn, no hoarse voice, no nausea, no sore throat, no stridor, no tooth decay, no water brash or no wheezing. This is a chronic problem. The current episode started more than 1 year ago. The problem occurs occasionally. The problem has been gradually improving. The symptoms are aggravated by certain foods. Pertinent negatives include no fatigue. She has tried a PPI for the symptoms. The treatment provided moderate relief.  Hypertension This is a chronic problem. The current episode started more than 1 year ago. The problem has been gradually improving since onset. The problem is controlled. Pertinent negatives include no chest pain, headaches or shortness of breath. There are no associated agents to hypertension. Risk factors for coronary artery disease include dyslipidemia. There are no compliance problems.  There is no history of chronic renal disease, a hypertension causing med or renovascular disease.  Hyperlipidemia This is a chronic problem. The current episode started more than 1 year ago. The problem is controlled. Recent lipid tests were reviewed and are normal. She has no history of chronic renal disease, diabetes, hypothyroidism, liver disease, obesity or nephrotic syndrome. Factors aggravating her hyperlipidemia include thiazides. Pertinent negatives include no chest pain, focal sensory loss, focal weakness, leg pain, myalgias or shortness of breath. The current treatment provides moderate improvement of lipids.    Lab Results  Component Value Date   CREATININE 0.62 06/26/2019   BUN 9 06/26/2019   NA 143 06/26/2019    K 4.0 06/26/2019   CL 100 06/26/2019   CO2 28 06/26/2019   Lab Results  Component Value Date   CHOL 175 06/26/2019   HDL 70 06/26/2019   LDLCALC 93 06/26/2019   TRIG 65 06/26/2019   CHOLHDL 2.9 12/23/2017   No results found for: TSH No results found for: HGBA1C No results found for: WBC, HGB, HCT, MCV, PLT Lab Results  Component Value Date   ALT 11 06/26/2019   AST 19 06/26/2019   ALKPHOS 70 06/26/2019   BILITOT 0.4 06/26/2019     Review of Systems  Constitutional: Negative.  Negative for chills, fatigue, fever and unexpected weight change.  HENT: Negative for congestion, ear discharge, ear pain, hoarse voice, nosebleeds, rhinorrhea, sinus pressure, sneezing and sore throat.   Eyes: Negative for photophobia, pain, discharge, redness and itching.  Respiratory: Negative for cough, choking, shortness of breath, wheezing and stridor.   Cardiovascular: Negative for chest pain.  Gastrointestinal: Negative for abdominal pain, blood in stool, constipation, diarrhea, dysphagia, heartburn, nausea and vomiting.  Endocrine: Negative for cold intolerance, heat intolerance, polydipsia, polyphagia and polyuria.  Genitourinary: Negative for dysuria, flank pain, frequency, hematuria, menstrual problem, pelvic pain, urgency, vaginal bleeding and vaginal discharge.  Musculoskeletal: Negative for arthralgias, back pain and myalgias.  Skin: Negative for rash.  Allergic/Immunologic: Negative for environmental allergies and food allergies.  Neurological: Negative for dizziness, focal weakness, weakness, light-headedness, numbness and headaches.  Hematological: Negative for adenopathy. Does not bruise/bleed easily.  Psychiatric/Behavioral: Negative for dysphoric mood. The patient is not nervous/anxious.     Patient Active Problem List   Diagnosis Date Noted  . Familial hypercholesterolemia 06/26/2019  . Class 2 obesity with body  mass index (BMI) of 38.0 to 38.9 in adult 06/26/2019  .  Gastroesophageal reflux disease 09/12/2018  . Primary osteoarthritis involving multiple joints 09/12/2018  . Degenerative lumbar disc 09/12/2018  . Pure hypercholesterolemia 12/23/2017  . Allergic rhinitis 12/17/2014  . Encounter for general adult medical examination without abnormal findings 12/17/2014  . Essential (primary) hypertension 12/17/2014  . Esophagitis, reflux 12/17/2014  . Hypokalemia 12/17/2014  . Abnormal weight gain 12/17/2014  . Dependent edema 12/17/2014    No Known Allergies  Past Surgical History:  Procedure Laterality Date  . CESAREAN SECTION     x 1  . VAGINAL HYSTERECTOMY      Social History   Tobacco Use  . Smoking status: Never Smoker  . Smokeless tobacco: Never Used  Substance Use Topics  . Alcohol use: No    Alcohol/week: 0.0 standard drinks  . Drug use: No     Medication list has been reviewed and updated.  Current Meds  Medication Sig  . aspirin 81 MG tablet Take 81 mg by mouth daily.  Marland Kitchen atenolol (TENORMIN) 100 MG tablet TAKE ONE (1) TABLET BY MOUTH ONCE DAILY  . cholecalciferol (VITAMIN D) 1000 units tablet Take 1,000 Units by mouth daily.  . cyclobenzaprine (FLEXERIL) 5 MG tablet Take 1 tablet (5 mg total) by mouth at bedtime.  . hydrochlorothiazide (HYDRODIURIL) 25 MG tablet TAKE (1) TABLET BY MOUTH EVERY DAY  . ibuprofen (ADVIL,MOTRIN) 600 MG tablet Take 1 tablet (600 mg total) by mouth every 8 (eight) hours as needed.  . meclizine (ANTIVERT) 25 MG tablet Take 1 tablet (25 mg total) by mouth 3 (three) times daily as needed for dizziness.  . meloxicam (MOBIC) 15 MG tablet Take 1 tablet (15 mg total) by mouth daily.  . pantoprazole (PROTONIX) 40 MG tablet TAKE ONE (1) TABLET BY MOUTH ONCE DAILY  . potassium chloride SA (KLOR-CON) 20 MEQ tablet TAKE ONE (1) TABLET BY MOUTH ONCE DAILY  . predniSONE (DELTASONE) 10 MG tablet Taper 6,6,6,5,5,5,4,4,3,3,2,2,1,1    PHQ 2/9 Scores 12/27/2019 11/29/2019 06/26/2019 05/21/2019  PHQ - 2 Score 0 0 0 0   PHQ- 9 Score 0 0 0 0    GAD 7 : Generalized Anxiety Score 12/27/2019 11/29/2019 06/26/2019  Nervous, Anxious, on Edge 0 0 0  Control/stop worrying 0 0 0  Worry too much - different things 0 0 0  Trouble relaxing 0 0 0  Restless 0 0 0  Easily annoyed or irritable 0 0 0  Afraid - awful might happen 0 0 0  Total GAD 7 Score 0 0 0  Anxiety Difficulty Not difficult at all - -    BP Readings from Last 3 Encounters:  12/27/19 (!) 138/56  11/29/19 130/70  07/17/19 124/64    Physical Exam Vitals and nursing note reviewed.  Constitutional:      Appearance: She is well-developed.  HENT:     Head: Normocephalic.     Right Ear: Tympanic membrane, ear canal and external ear normal. There is no impacted cerumen.     Left Ear: Tympanic membrane, ear canal and external ear normal. There is no impacted cerumen.  Eyes:     General: Lids are everted, no foreign bodies appreciated. No scleral icterus.       Left eye: No foreign body or hordeolum.     Conjunctiva/sclera: Conjunctivae normal.     Right eye: Right conjunctiva is not injected.     Left eye: Left conjunctiva is not injected.     Pupils:  Pupils are equal, round, and reactive to light.  Neck:     Thyroid: No thyromegaly.     Vascular: No JVD.     Trachea: No tracheal deviation.  Cardiovascular:     Rate and Rhythm: Normal rate and regular rhythm.     Heart sounds: Normal heart sounds. No murmur heard.  No friction rub. No gallop.   Pulmonary:     Effort: Pulmonary effort is normal. No respiratory distress.     Breath sounds: Normal breath sounds. No wheezing, rhonchi or rales.  Abdominal:     General: Bowel sounds are normal.     Palpations: Abdomen is soft. There is no mass.     Tenderness: There is no abdominal tenderness. There is no guarding or rebound.  Musculoskeletal:        General: No tenderness. Normal range of motion.     Cervical back: Normal range of motion and neck supple.  Lymphadenopathy:     Cervical: No  cervical adenopathy.  Skin:    General: Skin is warm.     Findings: No rash.  Neurological:     Mental Status: She is alert and oriented to person, place, and time.     Cranial Nerves: No cranial nerve deficit.     Deep Tendon Reflexes: Reflexes normal.  Psychiatric:        Mood and Affect: Mood is not anxious or depressed.     Wt Readings from Last 3 Encounters:  12/27/19 209 lb (94.8 kg)  11/29/19 207 lb (93.9 kg)  07/17/19 209 lb (94.8 kg)    BP (!) 138/56   Pulse 62   Ht 5\' 2"  (1.575 m)   Wt 209 lb (94.8 kg)   BMI 38.23 kg/m   Assessment and Plan: 1. Essential (primary) hypertension Chronic.  Controlled.  Stable.  Blood pressure today is 130/58.  Continue atenolol 100 mg 1 tablet once a day as well as hydrochlorothiazide 25 mg once a day. - atenolol (TENORMIN) 100 MG tablet; TAKE ONE (1) TABLET BY MOUTH ONCE DAILY  Dispense: 90 tablet; Refill: 1 - hydrochlorothiazide (HYDRODIURIL) 25 MG tablet; TAKE (1) TABLET BY MOUTH EVERY DAY  Dispense: 90 tablet; Refill: 1  2. Gastroesophageal reflux disease, unspecified whether esophagitis present Chronic.  Controlled.  Stable.  Continue pantoprazole 40 mg once a day. - pantoprazole (PROTONIX) 40 MG tablet; TAKE ONE (1) TABLET BY MOUTH ONCE DAILY  Dispense: 90 tablet; Refill: 1  3. Hypokalemia Patient with history of hypokalemia secondary to diuretic.  We will supplement with potassium chloride sustained release 20 mEq daily. - potassium chloride SA (KLOR-CON) 20 MEQ tablet; TAKE ONE (1) TABLET BY MOUTH ONCE DAILY  Dispense: 90 tablet; Refill: 1  4. Iliotibial band syndrome of right side Chronic.  Controlled.  Stable.  Patient would like a refill on her meloxicam 15 mg as well as her cyclobenzaprine 5 mg nightly. - meloxicam (MOBIC) 15 MG tablet; Take 1 tablet (15 mg total) by mouth daily.  Dispense: 30 tablet; Refill: 1 - cyclobenzaprine (FLEXERIL) 5 MG tablet; Take 1 tablet (5 mg total) by mouth at bedtime.  Dispense: 30 tablet;  Refill: 1  5. Trochanteric bursitis, right hip Follow-up.  Improved.  Stable.  Continue meloxicam and cyclobenzaprine as above. - meloxicam (MOBIC) 15 MG tablet; Take 1 tablet (15 mg total) by mouth daily.  Dispense: 30 tablet; Refill: 1 - cyclobenzaprine (FLEXERIL) 5 MG tablet; Take 1 tablet (5 mg total) by mouth at bedtime.  Dispense: 30 tablet;  Refill: 1  6. Primary osteoarthritis involving multiple joints Patient takes ibuprofen 600 mg every 8 hours but will not take while she is on meloxicam. - ibuprofen (ADVIL) 600 MG tablet; Take 1 tablet (600 mg total) by mouth every 8 (eight) hours as needed.  Dispense: 90 tablet; Refill: 1  7. Degenerative lumbar disc As noted above patient will take Advil 600 mg when she is out of her meloxicam in 2 months and she will not take this until she is no longer on meloxicam. - ibuprofen (ADVIL) 600 MG tablet; Take 1 tablet (600 mg total) by mouth every 8 (eight) hours as needed.  Dispense: 90 tablet; Refill: 1

## 2020-04-14 ENCOUNTER — Other Ambulatory Visit: Payer: Self-pay | Admitting: Family Medicine

## 2020-04-14 DIAGNOSIS — Z1231 Encounter for screening mammogram for malignant neoplasm of breast: Secondary | ICD-10-CM

## 2020-04-28 ENCOUNTER — Other Ambulatory Visit: Payer: Self-pay | Admitting: Family Medicine

## 2020-04-28 DIAGNOSIS — E876 Hypokalemia: Secondary | ICD-10-CM

## 2020-04-28 DIAGNOSIS — I10 Essential (primary) hypertension: Secondary | ICD-10-CM

## 2020-05-20 ENCOUNTER — Other Ambulatory Visit: Payer: Self-pay

## 2020-05-20 ENCOUNTER — Ambulatory Visit
Admission: RE | Admit: 2020-05-20 | Discharge: 2020-05-20 | Disposition: A | Discharge status: Home / Self Care | Payer: BC Managed Care – PPO | Source: Ambulatory Visit | Attending: Family Medicine | Admitting: Family Medicine

## 2020-05-20 DIAGNOSIS — Z1231 Encounter for screening mammogram for malignant neoplasm of breast: Secondary | ICD-10-CM | POA: Insufficient documentation

## 2020-06-10 ENCOUNTER — Ambulatory Visit: Payer: BC Managed Care – PPO | Admitting: Family Medicine

## 2020-11-01 ENCOUNTER — Other Ambulatory Visit: Payer: Self-pay | Admitting: Family Medicine

## 2020-11-01 DIAGNOSIS — I10 Essential (primary) hypertension: Secondary | ICD-10-CM

## 2020-11-04 ENCOUNTER — Ambulatory Visit: Payer: Self-pay | Admitting: Family Medicine

## 2021-01-16 ENCOUNTER — Other Ambulatory Visit: Payer: Self-pay | Admitting: Family Medicine

## 2021-01-16 DIAGNOSIS — K219 Gastro-esophageal reflux disease without esophagitis: Secondary | ICD-10-CM

## 2021-05-04 ENCOUNTER — Other Ambulatory Visit: Payer: Self-pay | Admitting: Family Medicine

## 2021-05-04 DIAGNOSIS — Z1231 Encounter for screening mammogram for malignant neoplasm of breast: Secondary | ICD-10-CM

## 2021-05-26 ENCOUNTER — Ambulatory Visit
Admission: RE | Admit: 2021-05-26 | Discharge: 2021-05-26 | Disposition: A | Discharge status: Home / Self Care | Payer: BC Managed Care – PPO | Source: Ambulatory Visit | Attending: Family Medicine | Admitting: Family Medicine

## 2021-05-26 ENCOUNTER — Other Ambulatory Visit: Payer: Self-pay

## 2021-05-26 DIAGNOSIS — Z1231 Encounter for screening mammogram for malignant neoplasm of breast: Secondary | ICD-10-CM | POA: Diagnosis not present

## 2022-04-27 ENCOUNTER — Other Ambulatory Visit: Payer: Self-pay | Admitting: Family Medicine

## 2022-04-27 DIAGNOSIS — Z1231 Encounter for screening mammogram for malignant neoplasm of breast: Secondary | ICD-10-CM

## 2022-06-01 ENCOUNTER — Ambulatory Visit
Admission: RE | Admit: 2022-06-01 | Discharge: 2022-06-01 | Disposition: A | Discharge status: Home / Self Care | Payer: BC Managed Care – PPO | Source: Ambulatory Visit | Attending: Family Medicine | Admitting: Family Medicine

## 2022-06-01 DIAGNOSIS — Z1231 Encounter for screening mammogram for malignant neoplasm of breast: Secondary | ICD-10-CM | POA: Insufficient documentation

## 2023-04-04 ENCOUNTER — Other Ambulatory Visit: Payer: Self-pay | Admitting: Family Medicine

## 2023-04-04 DIAGNOSIS — Z1231 Encounter for screening mammogram for malignant neoplasm of breast: Secondary | ICD-10-CM

## 2023-05-25 LAB — COLOGUARD: COLOGUARD: NEGATIVE

## 2023-05-25 LAB — EXTERNAL GENERIC LAB PROCEDURE: COLOGUARD: NEGATIVE

## 2023-06-14 ENCOUNTER — Other Ambulatory Visit: Payer: Self-pay | Admitting: Family Medicine

## 2023-06-14 DIAGNOSIS — M5416 Radiculopathy, lumbar region: Secondary | ICD-10-CM

## 2023-06-14 DIAGNOSIS — Z780 Asymptomatic menopausal state: Secondary | ICD-10-CM

## 2023-06-17 ENCOUNTER — Ambulatory Visit
Admission: RE | Admit: 2023-06-17 | Discharge: 2023-06-17 | Disposition: A | Discharge status: Home / Self Care | Payer: Medicare Other | Source: Ambulatory Visit | Attending: Family Medicine | Admitting: Family Medicine

## 2023-06-17 DIAGNOSIS — Z1231 Encounter for screening mammogram for malignant neoplasm of breast: Secondary | ICD-10-CM | POA: Insufficient documentation

## 2023-09-09 IMAGING — MG MM DIGITAL SCREENING BILAT W/ TOMO AND CAD
8 series · 8 of 24 positions shown · non-contrast
Comparison: Previous exam(s).

CLINICAL DATA: Screening.

EXAM:
DIGITAL SCREENING BILATERAL MAMMOGRAM WITH TOMOSYNTHESIS AND CAD
TECHNIQUE: Bilateral screening digital craniocaudal and mediolateral oblique
mammograms were obtained. Bilateral screening digital breast
tomosynthesis was performed. The images were evaluated with
computer-aided detection.

[L MLO synth-2D]
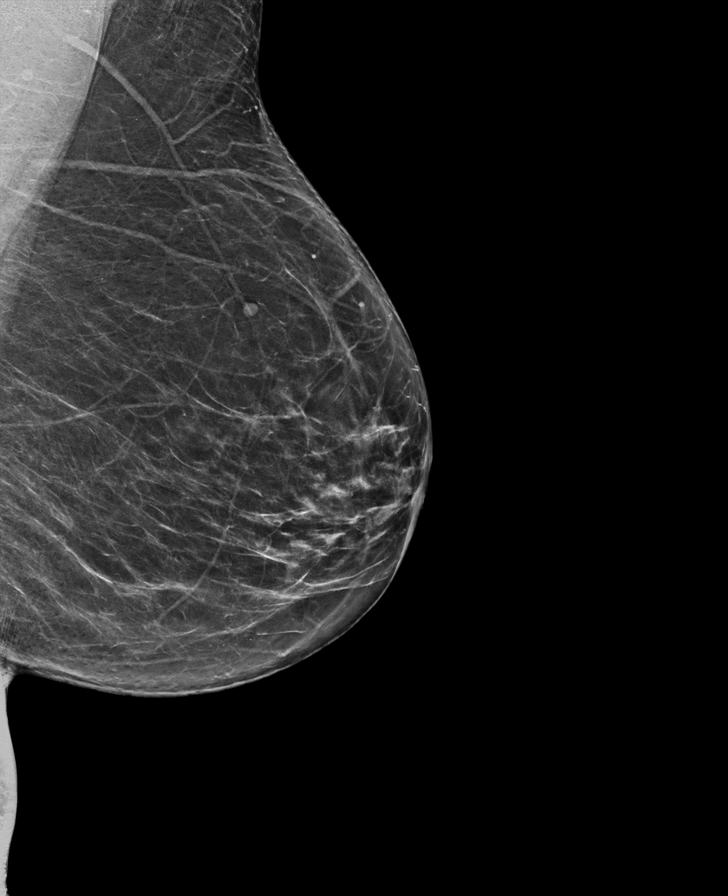

[L CC synth-2D]
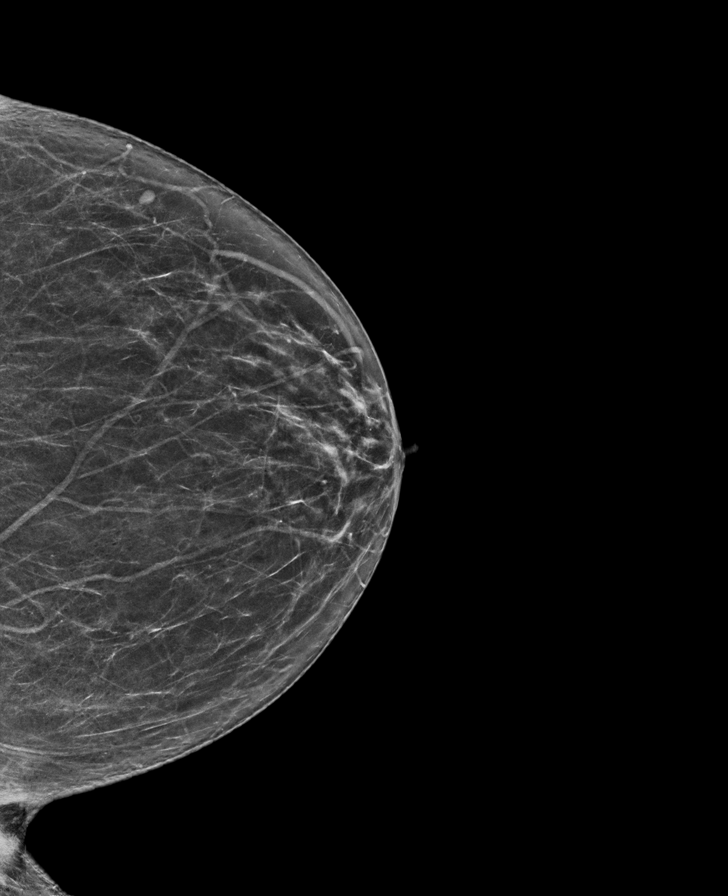

[R CC synth-2D]
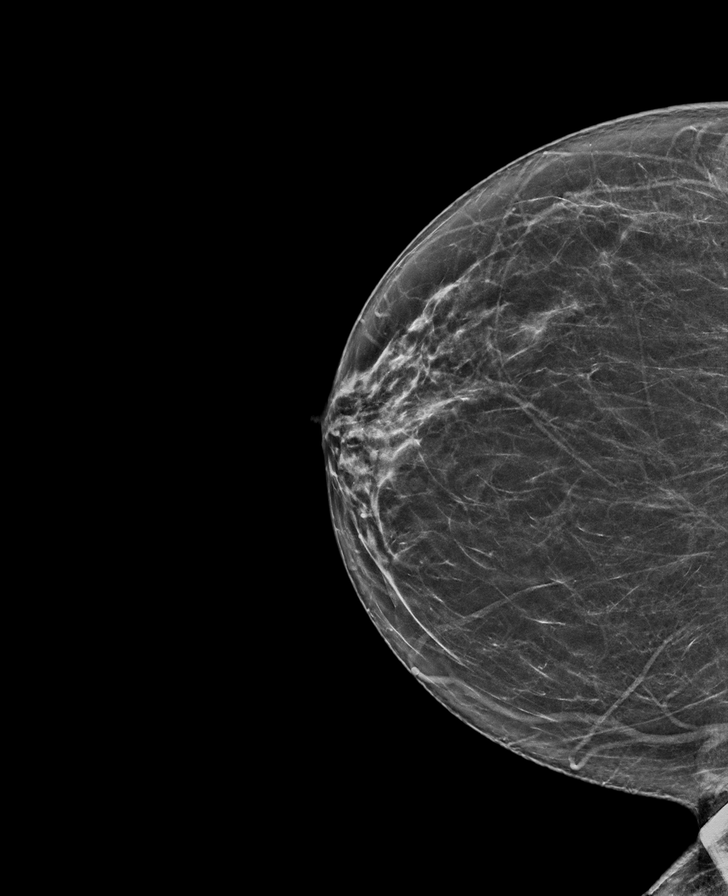

[R MLO synth-2D]
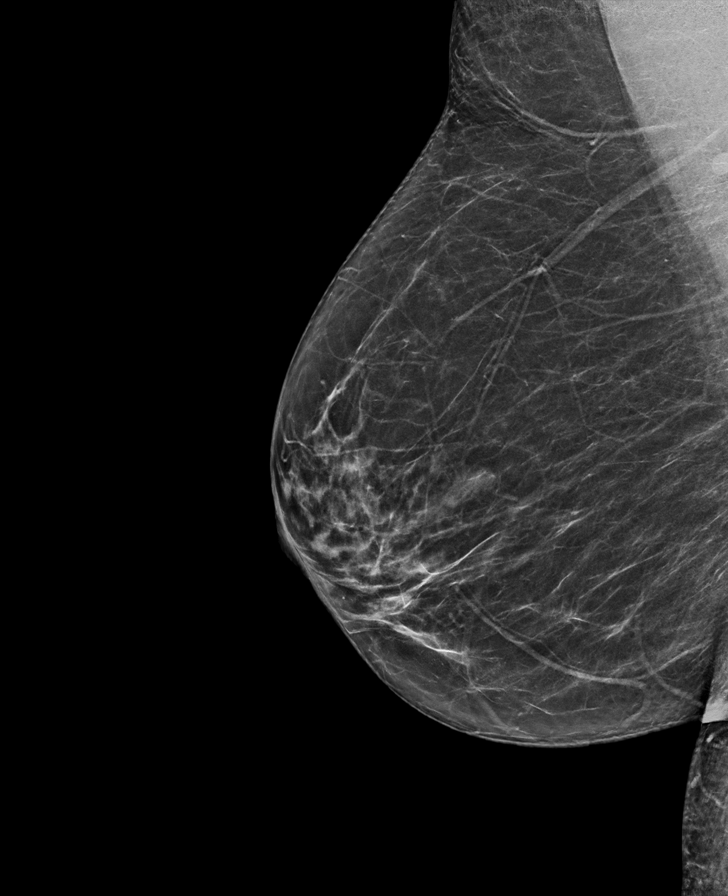

[R MLO tomo · tomo slice 35/69.0]
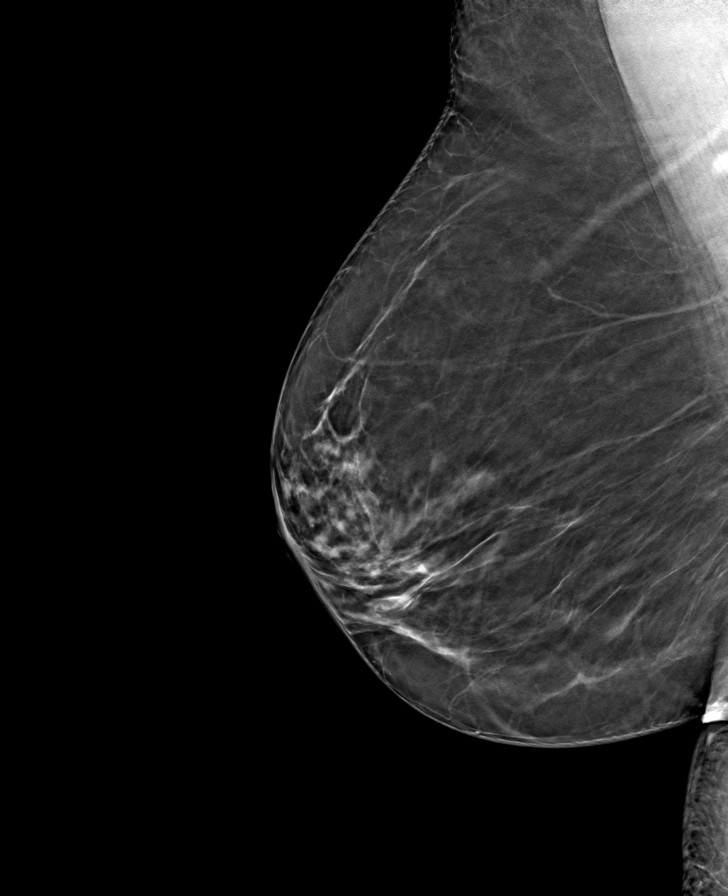

[L MLO tomo · tomo slice 37/74.0]
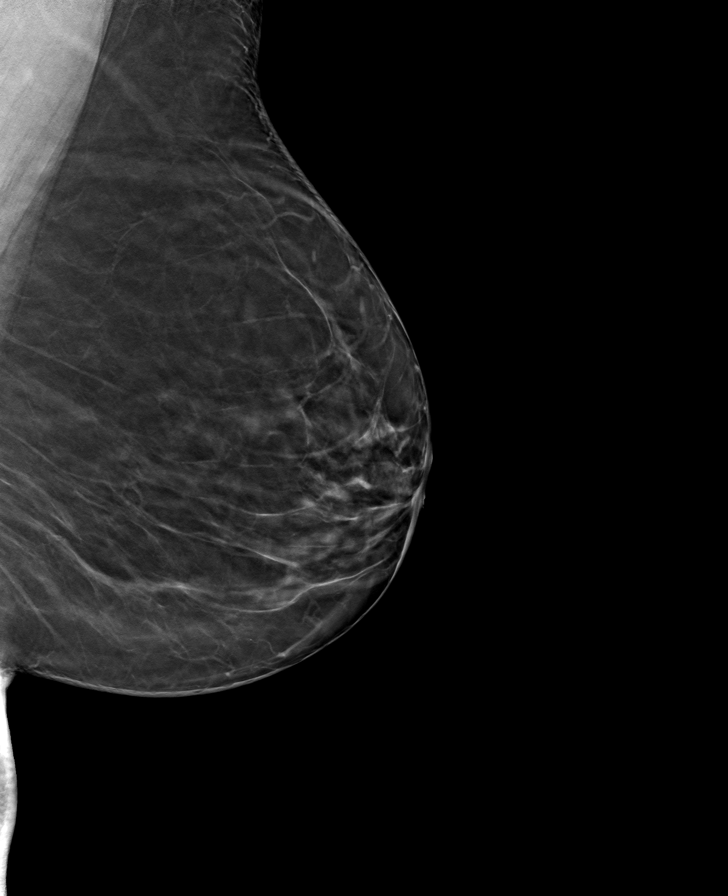

[R CC tomo · tomo slice 35/68.0]
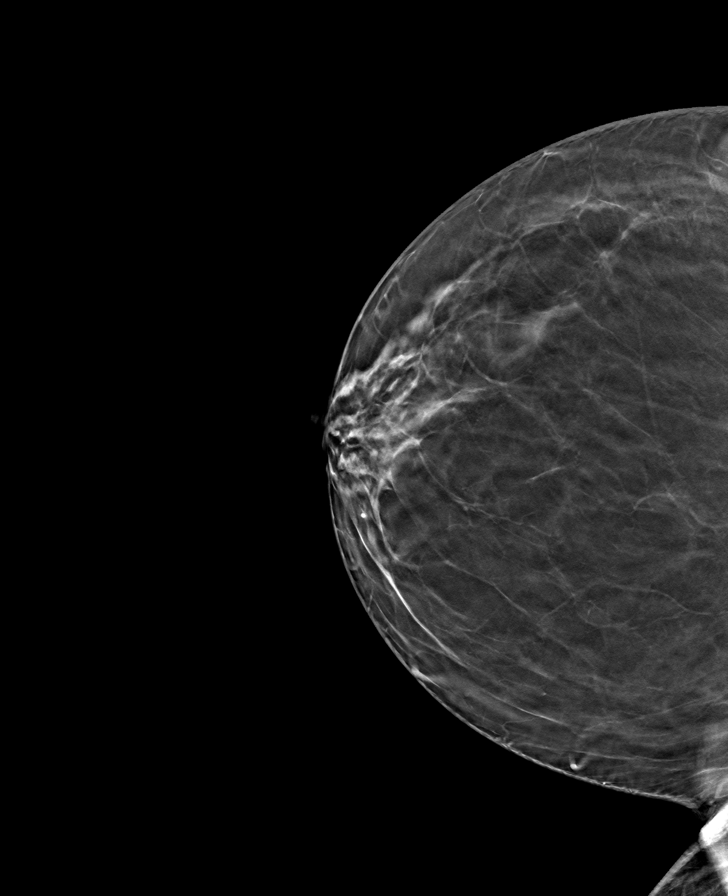

[L CC tomo · tomo slice 33/64.0]
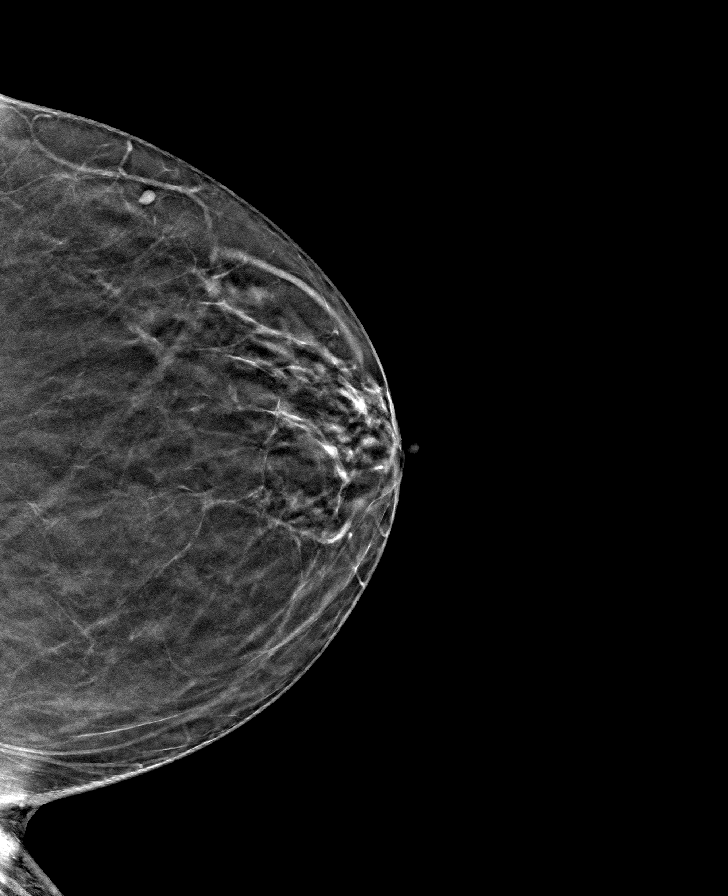

[8 of 24 positions shown; findings below may reference images not displayed]

ACR Breast Density Category b: There are scattered areas of
fibroglandular density.
FINDINGS: There are no findings suspicious for malignancy.
IMPRESSION: No mammographic evidence of malignancy. A result letter of this
screening mammogram will be mailed directly to the patient.

RECOMMENDATION:
Screening mammogram in one year. (Code:51-O-LD2)

BI-RADS CATEGORY  1: Negative.

## 2023-12-13 ENCOUNTER — Ambulatory Visit
Admission: RE | Admit: 2023-12-13 | Discharge: 2023-12-13 | Disposition: A | Discharge status: Home / Self Care | Source: Ambulatory Visit | Attending: Family Medicine | Admitting: Family Medicine

## 2023-12-13 DIAGNOSIS — Z780 Asymptomatic menopausal state: Secondary | ICD-10-CM | POA: Insufficient documentation

## 2023-12-13 DIAGNOSIS — M5416 Radiculopathy, lumbar region: Secondary | ICD-10-CM | POA: Insufficient documentation

## 2024-05-03 ENCOUNTER — Other Ambulatory Visit: Payer: Self-pay | Admitting: Family Medicine

## 2024-05-03 DIAGNOSIS — Z1231 Encounter for screening mammogram for malignant neoplasm of breast: Secondary | ICD-10-CM

## 2024-06-25 ENCOUNTER — Ambulatory Visit
Admission: RE | Admit: 2024-06-25 | Discharge: 2024-06-25 | Disposition: A | Discharge status: Home / Self Care | Source: Ambulatory Visit | Attending: Family Medicine | Admitting: Family Medicine

## 2024-06-25 DIAGNOSIS — Z1231 Encounter for screening mammogram for malignant neoplasm of breast: Secondary | ICD-10-CM | POA: Insufficient documentation
